# Patient Record
Sex: Female | Born: 1945
Health system: Southern US, Community
[De-identification: ages and names within clinical notes are randomized; demographics above are authoritative.]

## PROBLEM LIST (undated history)

## (undated) DIAGNOSIS — G4700 Insomnia, unspecified: Secondary | ICD-10-CM

## (undated) DIAGNOSIS — G8929 Other chronic pain: Secondary | ICD-10-CM

## (undated) DIAGNOSIS — M810 Age-related osteoporosis without current pathological fracture: Secondary | ICD-10-CM

## (undated) DIAGNOSIS — L309 Dermatitis, unspecified: Secondary | ICD-10-CM

## (undated) DIAGNOSIS — F419 Anxiety disorder, unspecified: Secondary | ICD-10-CM

## (undated) DIAGNOSIS — M545 Low back pain, unspecified: Secondary | ICD-10-CM

## (undated) DIAGNOSIS — K449 Diaphragmatic hernia without obstruction or gangrene: Secondary | ICD-10-CM

## (undated) DIAGNOSIS — D649 Anemia, unspecified: Secondary | ICD-10-CM

## (undated) DIAGNOSIS — K8020 Calculus of gallbladder without cholecystitis without obstruction: Secondary | ICD-10-CM

## (undated) DIAGNOSIS — K5900 Constipation, unspecified: Secondary | ICD-10-CM

## (undated) DIAGNOSIS — M5126 Other intervertebral disc displacement, lumbar region: Secondary | ICD-10-CM

## (undated) DIAGNOSIS — N289 Disorder of kidney and ureter, unspecified: Secondary | ICD-10-CM

## (undated) DIAGNOSIS — J449 Chronic obstructive pulmonary disease, unspecified: Secondary | ICD-10-CM

## (undated) DIAGNOSIS — F329 Major depressive disorder, single episode, unspecified: Secondary | ICD-10-CM

## (undated) DIAGNOSIS — J45909 Unspecified asthma, uncomplicated: Secondary | ICD-10-CM

## (undated) DIAGNOSIS — E041 Nontoxic single thyroid nodule: Secondary | ICD-10-CM

## (undated) DIAGNOSIS — A159 Respiratory tuberculosis unspecified: Secondary | ICD-10-CM

## (undated) DIAGNOSIS — F32A Depression, unspecified: Secondary | ICD-10-CM

## (undated) DIAGNOSIS — T7840XA Allergy, unspecified, initial encounter: Secondary | ICD-10-CM

## (undated) DIAGNOSIS — J309 Allergic rhinitis, unspecified: Secondary | ICD-10-CM

## (undated) DIAGNOSIS — K219 Gastro-esophageal reflux disease without esophagitis: Secondary | ICD-10-CM

## (undated) DIAGNOSIS — I10 Essential (primary) hypertension: Secondary | ICD-10-CM

## (undated) DIAGNOSIS — M1990 Unspecified osteoarthritis, unspecified site: Secondary | ICD-10-CM

## (undated) DIAGNOSIS — H269 Unspecified cataract: Secondary | ICD-10-CM

## (undated) HISTORY — DX: Unspecified osteoarthritis, unspecified site: M19.90

## (undated) HISTORY — DX: Other chronic pain: G89.29

## (undated) HISTORY — PX: BACK SURGERY: SHX140

## (undated) HISTORY — PX: COLONOSCOPY: SHX174

## (undated) HISTORY — DX: Essential (primary) hypertension: I10

## (undated) HISTORY — DX: Anemia, unspecified: D64.9

## (undated) HISTORY — DX: Gastro-esophageal reflux disease without esophagitis: K21.9

## (undated) HISTORY — DX: Insomnia, unspecified: G47.00

## (undated) HISTORY — PX: TONSILLECTOMY: SUR1361

## (undated) HISTORY — PX: CHOLECYSTECTOMY: SHX55

## (undated) HISTORY — DX: Nontoxic single thyroid nodule: E04.1

## (undated) HISTORY — DX: Chronic obstructive pulmonary disease, unspecified: J44.9

## (undated) HISTORY — DX: Other intervertebral disc displacement, lumbar region: M51.26

## (undated) HISTORY — DX: Calculus of gallbladder without cholecystitis without obstruction: K80.20

## (undated) HISTORY — DX: Allergy, unspecified, initial encounter: T78.40XA

## (undated) HISTORY — PX: THYROIDECTOMY, PARTIAL: SHX18

## (undated) HISTORY — DX: Unspecified cataract: H26.9

## (undated) HISTORY — DX: Allergic rhinitis, unspecified: J30.9

## (undated) HISTORY — PX: LUMBAR DISC SURGERY: SHX700

## (undated) HISTORY — PX: TUBAL LIGATION: SHX77

## (undated) HISTORY — DX: Unspecified asthma, uncomplicated: J45.909

---

## 1998-08-10 ENCOUNTER — Other Ambulatory Visit: Admission: RE | Admit: 1998-08-10 | Discharge: 1998-08-10 | Payer: Self-pay | Admitting: Obstetrics & Gynecology

## 1998-09-16 ENCOUNTER — Other Ambulatory Visit: Admission: RE | Admit: 1998-09-16 | Discharge: 1998-09-16 | Payer: Self-pay | Admitting: Obstetrics & Gynecology

## 2001-01-07 ENCOUNTER — Encounter: Payer: Self-pay | Admitting: Family Medicine

## 2001-01-07 ENCOUNTER — Ambulatory Visit (HOSPITAL_COMMUNITY): Admission: RE | Admit: 2001-01-07 | Discharge: 2001-01-07 | Payer: Self-pay | Admitting: Family Medicine

## 2001-05-29 ENCOUNTER — Ambulatory Visit (HOSPITAL_COMMUNITY): Admission: RE | Admit: 2001-05-29 | Discharge: 2001-05-29 | Payer: Self-pay | Admitting: Family Medicine

## 2001-05-29 ENCOUNTER — Encounter: Payer: Self-pay | Admitting: Family Medicine

## 2001-07-15 ENCOUNTER — Other Ambulatory Visit: Admission: RE | Admit: 2001-07-15 | Discharge: 2001-07-15 | Payer: Self-pay | Admitting: Obstetrics & Gynecology

## 2001-07-20 ENCOUNTER — Encounter: Admission: RE | Admit: 2001-07-20 | Discharge: 2001-07-20 | Payer: Self-pay | Admitting: Orthopedic Surgery

## 2001-07-20 ENCOUNTER — Encounter: Payer: Self-pay | Admitting: Orthopedic Surgery

## 2001-10-06 ENCOUNTER — Encounter: Payer: Self-pay | Admitting: Neurosurgery

## 2001-10-08 ENCOUNTER — Encounter: Payer: Self-pay | Admitting: Neurosurgery

## 2001-10-08 ENCOUNTER — Ambulatory Visit (HOSPITAL_COMMUNITY): Admission: RE | Admit: 2001-10-08 | Discharge: 2001-10-08 | Payer: Self-pay | Admitting: Neurosurgery

## 2002-02-06 ENCOUNTER — Encounter: Payer: Self-pay | Admitting: Neurosurgery

## 2002-02-06 ENCOUNTER — Ambulatory Visit (HOSPITAL_COMMUNITY): Admission: RE | Admit: 2002-02-06 | Discharge: 2002-02-06 | Payer: Self-pay | Admitting: Neurosurgery

## 2003-02-18 ENCOUNTER — Encounter: Payer: Self-pay | Admitting: Anesthesiology

## 2003-02-18 ENCOUNTER — Ambulatory Visit (HOSPITAL_COMMUNITY): Admission: RE | Admit: 2003-02-18 | Discharge: 2003-02-18 | Payer: Self-pay | Admitting: Anesthesiology

## 2004-08-18 ENCOUNTER — Other Ambulatory Visit: Admission: RE | Admit: 2004-08-18 | Discharge: 2004-08-18 | Payer: Self-pay | Admitting: Obstetrics & Gynecology

## 2010-11-28 ENCOUNTER — Other Ambulatory Visit: Payer: Self-pay | Admitting: Family Medicine

## 2010-11-30 ENCOUNTER — Ambulatory Visit
Admission: RE | Admit: 2010-11-30 | Discharge: 2010-11-30 | Disposition: A | Discharge status: Home / Self Care | Payer: No Typology Code available for payment source | Source: Ambulatory Visit | Attending: Family Medicine | Admitting: Family Medicine

## 2010-11-30 IMAGING — US US ABDOMEN COMPLETE
1 series · 14 of 25 positions shown · non-contrast
Comparison: None.

CLINICAL DATA: Abdominal pain, chest pain

COMPLETE ABDOMINAL ULTRASOUND

[Series 1: us abdomen complete · 14 of 105 slices shown]
[im 1/105]
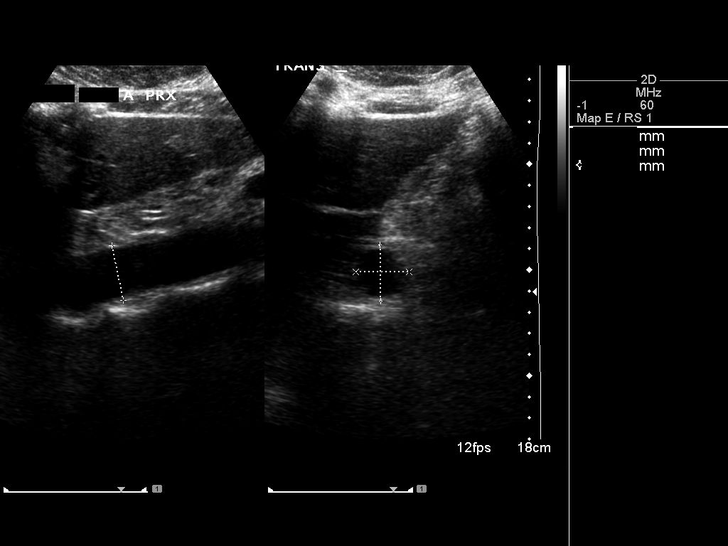
[im 9/105]
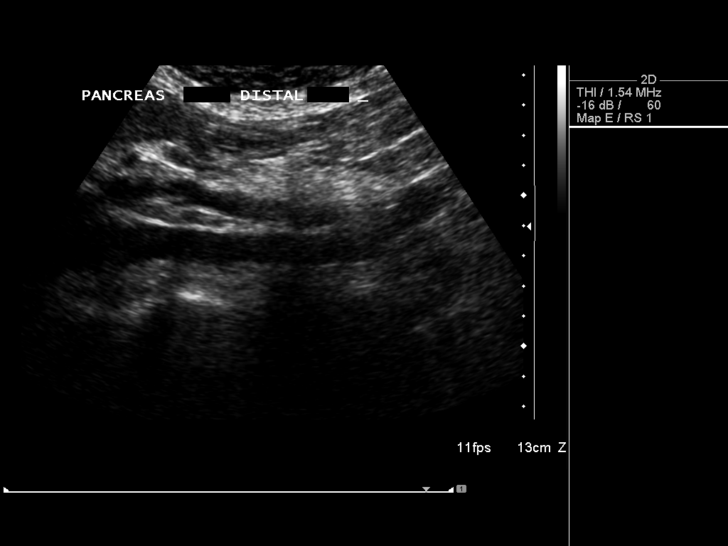
[im 18/105]
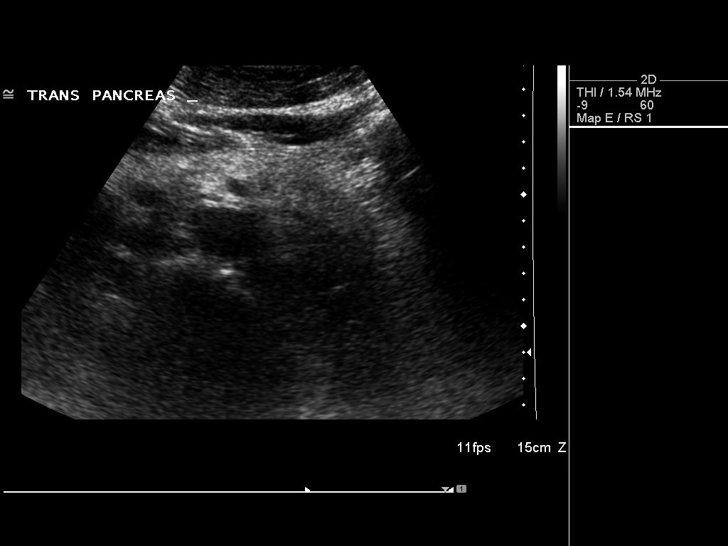
[im 27/105]
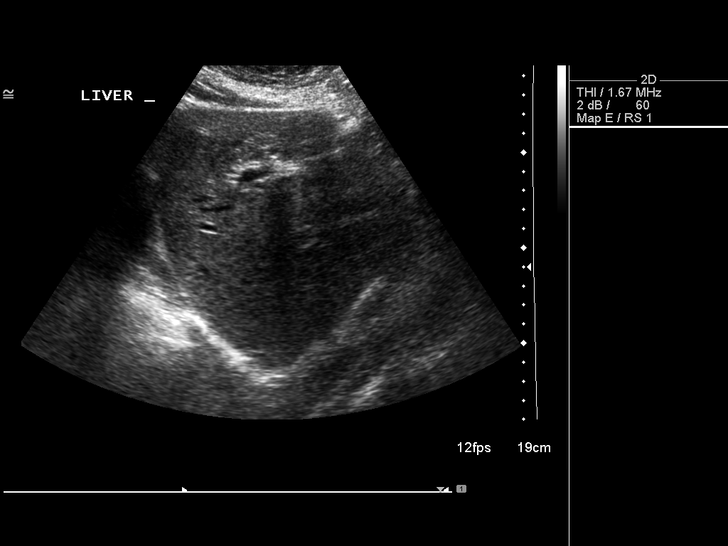
[im 35/105]
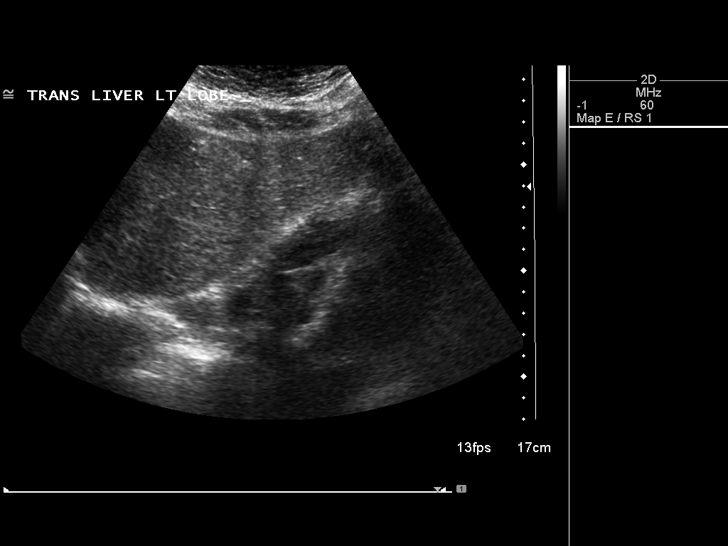
[im 40/105]
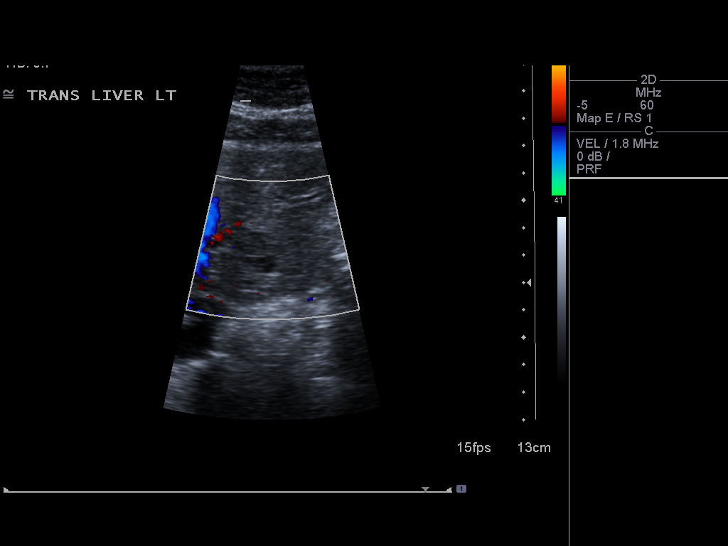
[im 48/105]
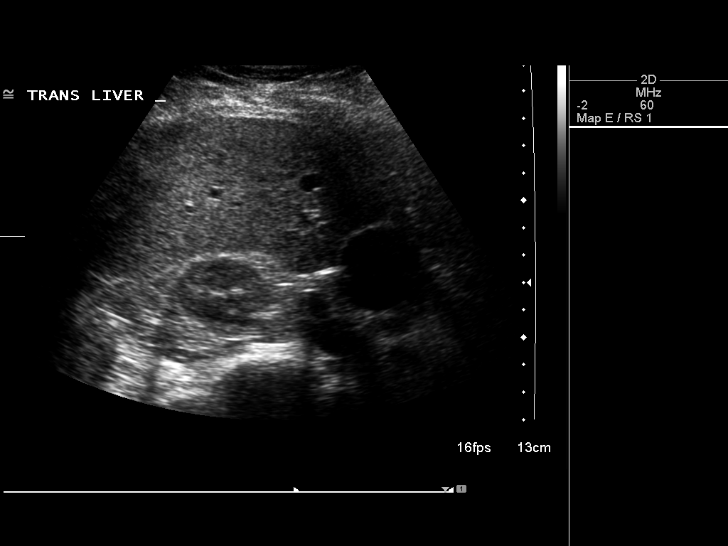
[im 57/105]
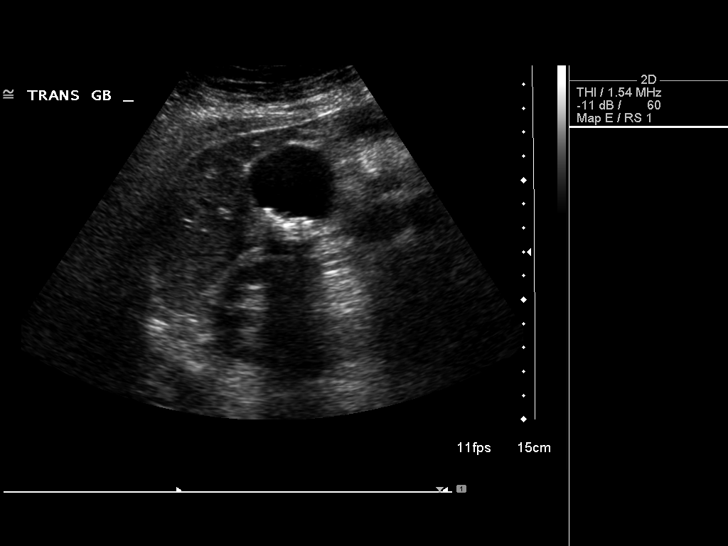
[im 66/105]
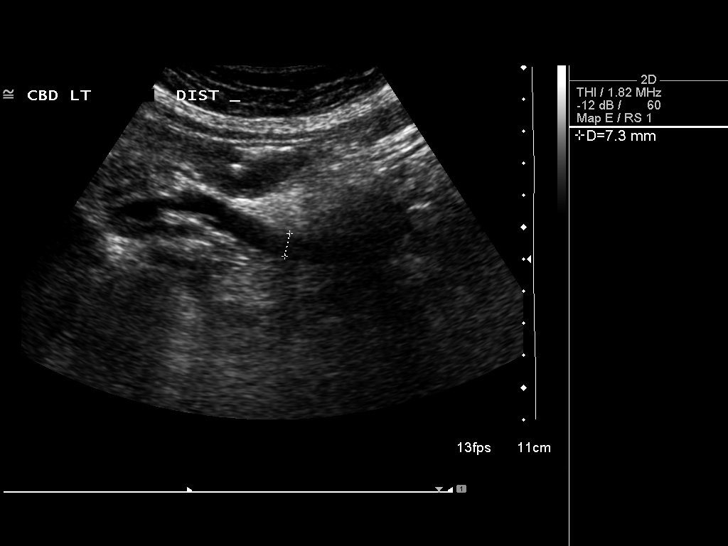
[im 70/105]
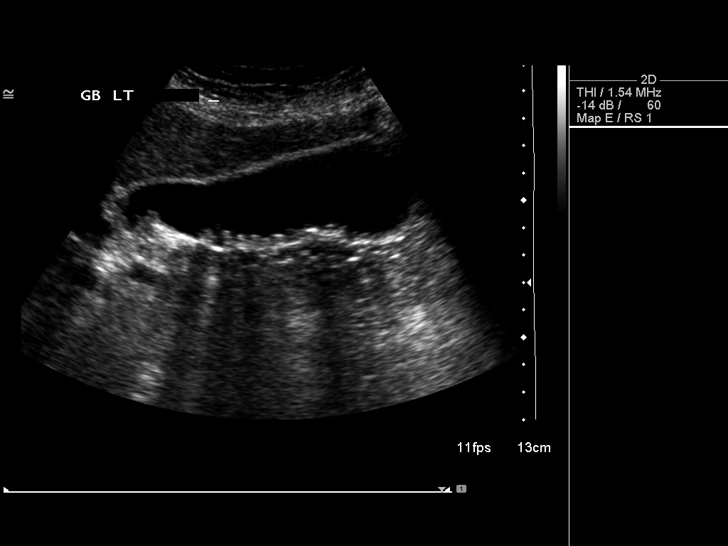
[im 79/105]
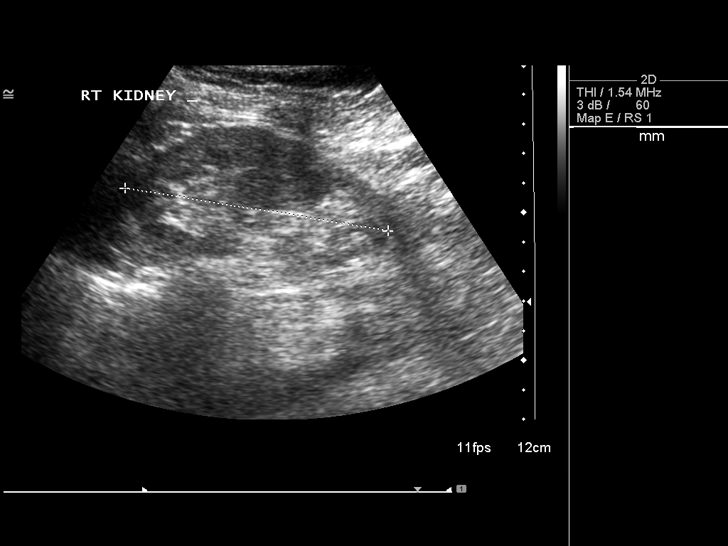
[im 87/105]
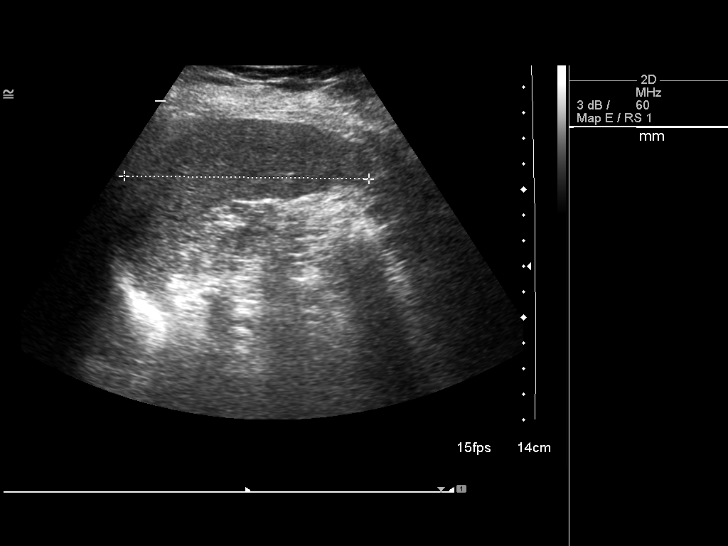
[im 96/105]
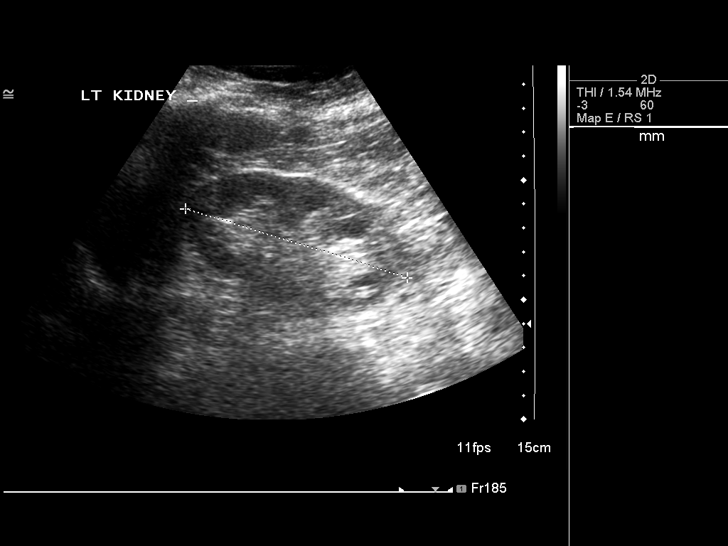
[im 105/105]
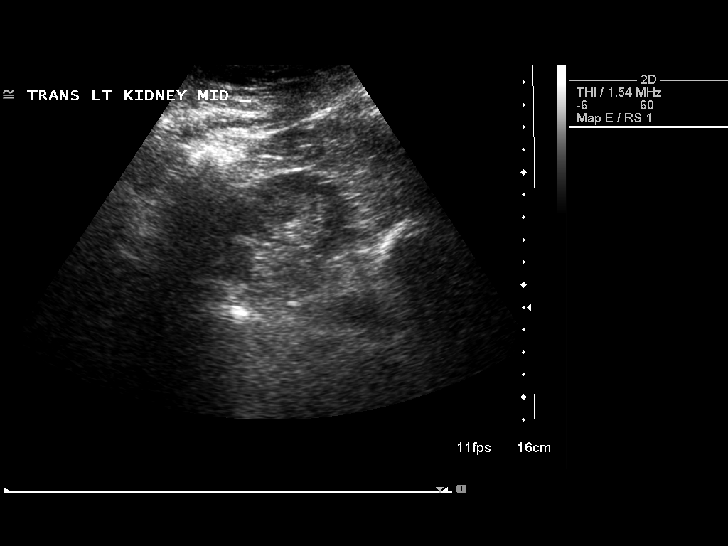

[14 of 25 positions shown; findings below may reference images not displayed]

FINDINGS: Gallbladder:  There are multiple echogenic gallstones within the
gallbladder.  The stones  measures 6 mm or less and number
approximately 20.  No gallbladder wall thickening or
pericholecystic fluid.  Negative sonographic Murphy's sign.

Common bile duct:  The common bile duct is prominent measuring up
to 10.5 mm.

Liver:  No intrahepatic ductal dilatation.  Two small 1 cm anechoic
cysts are noted.

IVC:  Appears normal.

Pancreas:  No focal abnormality seen.

Spleen:  Normal in size and echogenicity peri

Right Kidney:  9.0cm in length.  No evidence of hydronephrosis or
stones.

Left Kidney:  9.7cm in length.  No evidence of hydronephrosis or
stones.

Abdominal aorta:  No aneurysm identified.
IMPRESSION: 1.  Multiple small gallstones with out evidence of acute
cholecystitis.
2.  Dilatation of the common bile duct.  Cannot exclude a distal
partially obstructing stone.  If the patient has elevated LFTs or
bilirubin, consider MRCP for further evaluation of the distal
common bile duct.
3.  Two benign-appearing hepatic cysts are noted.

## 2011-01-17 ENCOUNTER — Ambulatory Visit (HOSPITAL_COMMUNITY)
Admission: RE | Admit: 2011-01-17 | Discharge: 2011-01-17 | Disposition: A | Discharge status: Home / Self Care | Payer: No Typology Code available for payment source | Source: Ambulatory Visit | Attending: General Surgery | Admitting: General Surgery

## 2011-01-17 ENCOUNTER — Other Ambulatory Visit (HOSPITAL_COMMUNITY): Payer: Self-pay | Admitting: General Surgery

## 2011-01-17 ENCOUNTER — Encounter (HOSPITAL_COMMUNITY)
Admission: RE | Admit: 2011-01-17 | Discharge: 2011-01-17 | Disposition: A | Discharge status: Home / Self Care | Payer: No Typology Code available for payment source | Source: Ambulatory Visit | Attending: General Surgery | Admitting: General Surgery

## 2011-01-17 DIAGNOSIS — Z01818 Encounter for other preprocedural examination: Secondary | ICD-10-CM | POA: Insufficient documentation

## 2011-01-17 DIAGNOSIS — K8020 Calculus of gallbladder without cholecystitis without obstruction: Secondary | ICD-10-CM

## 2011-01-17 DIAGNOSIS — Z01812 Encounter for preprocedural laboratory examination: Secondary | ICD-10-CM | POA: Insufficient documentation

## 2011-01-17 LAB — COMPREHENSIVE METABOLIC PANEL
Albumin: 3.8 g/dL (ref 3.5–5.2)
Alkaline Phosphatase: 156 U/L — ABNORMAL HIGH (ref 39–117)
BUN: 10 mg/dL (ref 6–23)
Chloride: 103 mEq/L (ref 96–112)
Creatinine, Ser: 0.9 mg/dL (ref 0.4–1.2)
Glucose, Bld: 97 mg/dL (ref 70–99)
Potassium: 3.6 mEq/L (ref 3.5–5.1)
Total Bilirubin: 0.5 mg/dL (ref 0.3–1.2)

## 2011-01-17 LAB — CBC
HCT: 35.8 % — ABNORMAL LOW (ref 36.0–46.0)
MCH: 30.5 pg (ref 26.0–34.0)
MCV: 91.1 fL (ref 78.0–100.0)
Platelets: 248 10*3/uL (ref 150–400)
RBC: 3.93 MIL/uL (ref 3.87–5.11)
RDW: 13.9 % (ref 11.5–15.5)
WBC: 8 10*3/uL (ref 4.0–10.5)

## 2011-01-17 LAB — DIFFERENTIAL
Eosinophils Absolute: 0.4 10*3/uL (ref 0.0–0.7)
Eosinophils Relative: 5 % (ref 0–5)
Lymphocytes Relative: 27 % (ref 12–46)
Lymphs Abs: 2.2 10*3/uL (ref 0.7–4.0)
Monocytes Relative: 9 % (ref 3–12)

## 2011-01-17 LAB — SURGICAL PCR SCREEN
MRSA, PCR: NEGATIVE
Staphylococcus aureus: POSITIVE — AB

## 2011-01-17 IMAGING — CR DG CHEST 2V
2 series · 2 of 2 positions shown · non-contrast
Comparison: None.

CLINICAL DATA: Preop evaluation for cholelithiasis.

CHEST - 2 VIEW

[view not recorded (1 of 2)]
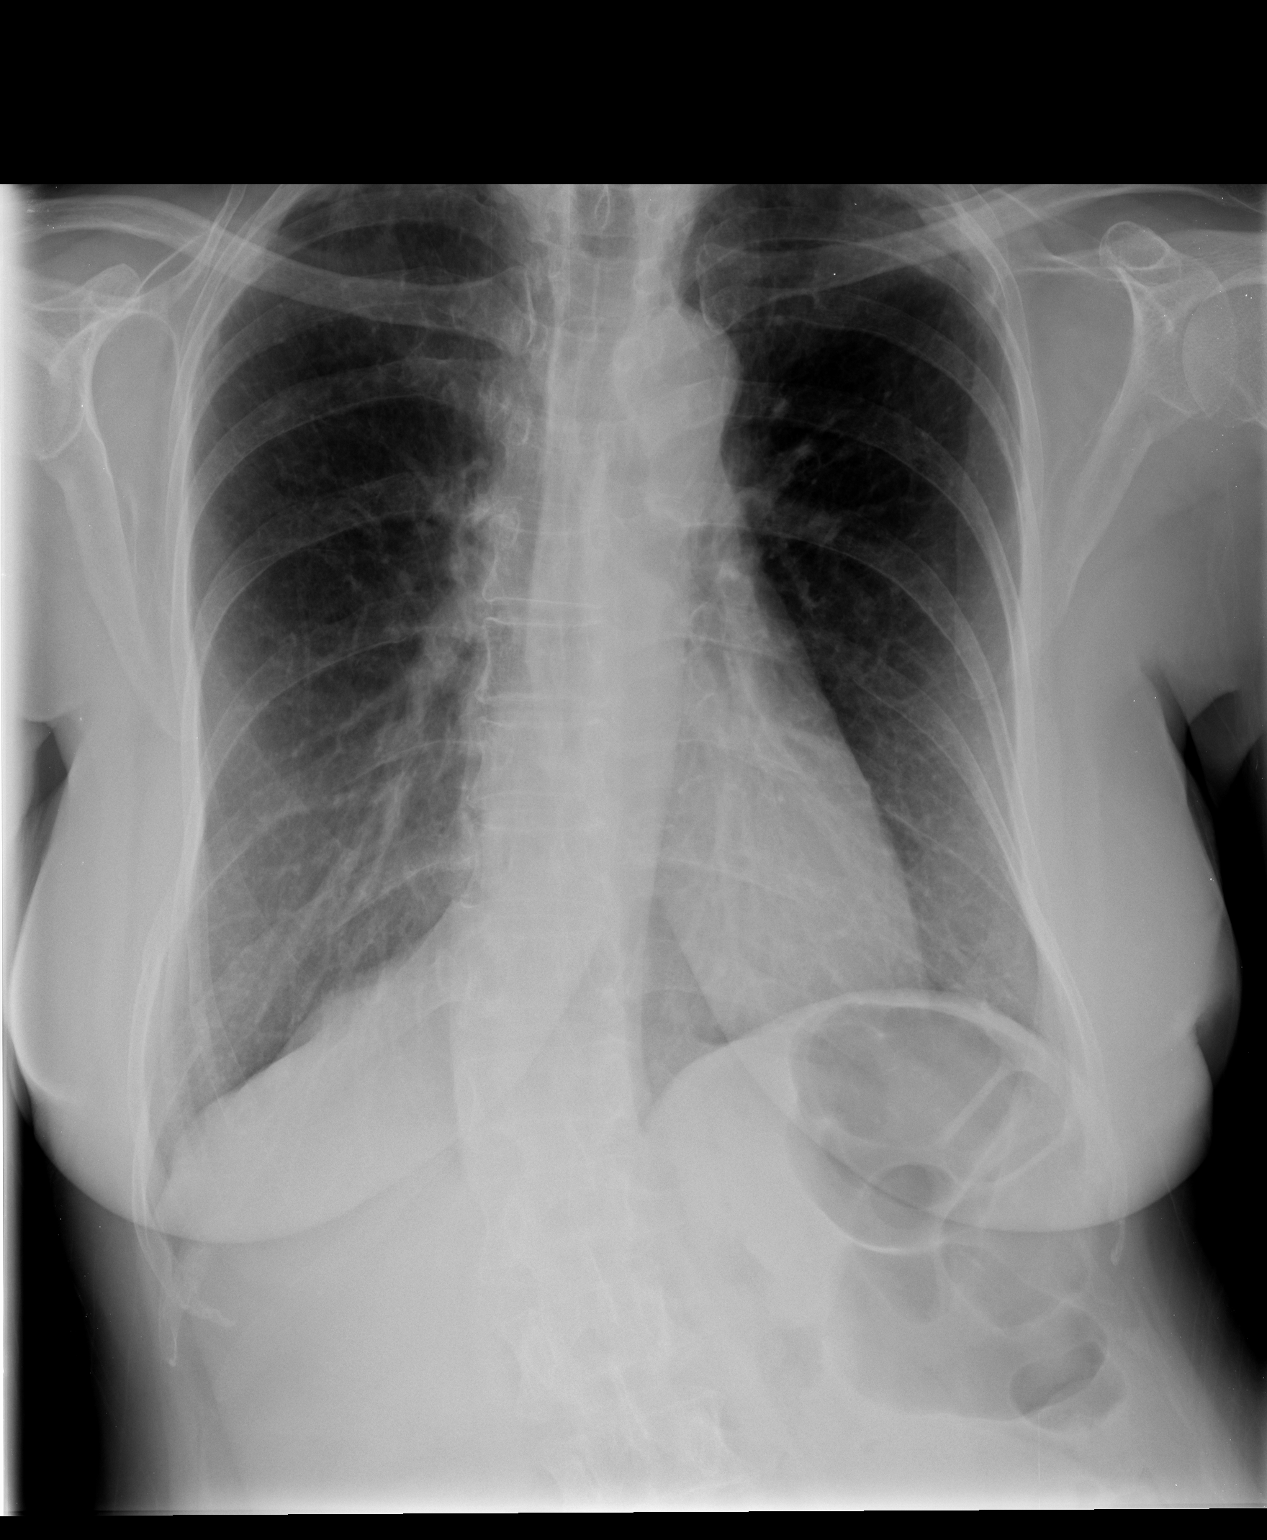

[view not recorded (2 of 2)]
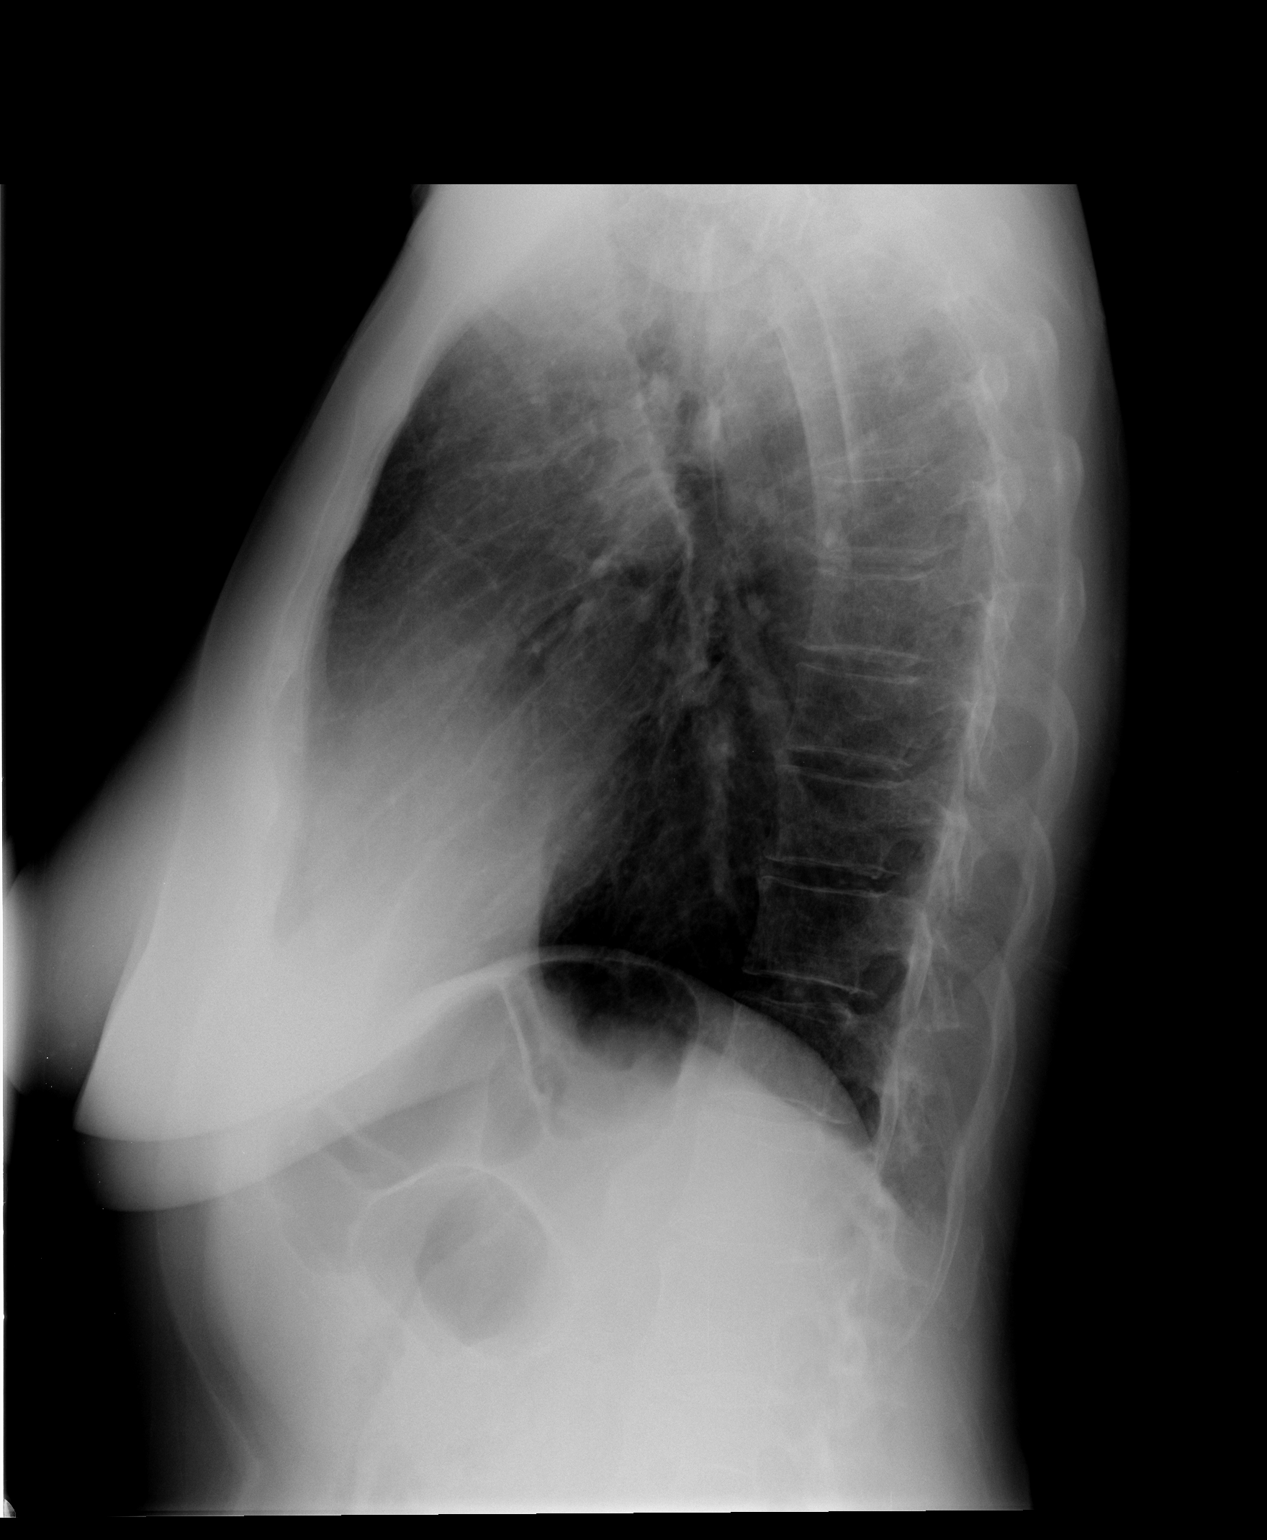

[2 of 2 positions shown; findings below may reference images not displayed]

FINDINGS: Hyperexpansion is consistent with emphysema.  Biapical
pleuroparenchymal opacity is symmetric most likely reflects
scarring. The cardiopericardial silhouette is enlarged. Imaged bony
structures of the thorax are intact.
IMPRESSION: No acute cardiopulmonary findings.

## 2011-01-23 ENCOUNTER — Ambulatory Visit (HOSPITAL_COMMUNITY): Payer: No Typology Code available for payment source

## 2011-01-23 ENCOUNTER — Observation Stay (HOSPITAL_COMMUNITY)
Admission: RE | Admit: 2011-01-23 | Discharge: 2011-01-25 | Disposition: A | Discharge status: Home / Self Care | Payer: No Typology Code available for payment source | Source: Ambulatory Visit | Attending: General Surgery | Admitting: General Surgery

## 2011-01-23 ENCOUNTER — Other Ambulatory Visit: Payer: Self-pay | Admitting: General Surgery

## 2011-01-23 DIAGNOSIS — E039 Hypothyroidism, unspecified: Secondary | ICD-10-CM | POA: Insufficient documentation

## 2011-01-23 DIAGNOSIS — Z87891 Personal history of nicotine dependence: Secondary | ICD-10-CM | POA: Insufficient documentation

## 2011-01-23 DIAGNOSIS — K8070 Calculus of gallbladder and bile duct without cholecystitis without obstruction: Principal | ICD-10-CM | POA: Insufficient documentation

## 2011-01-23 DIAGNOSIS — G43909 Migraine, unspecified, not intractable, without status migrainosus: Secondary | ICD-10-CM | POA: Insufficient documentation

## 2011-01-23 DIAGNOSIS — M129 Arthropathy, unspecified: Secondary | ICD-10-CM | POA: Insufficient documentation

## 2011-01-23 IMAGING — RF DG CHOLANGIOGRAM OPERATIVE
1 series · 5 of 5 positions shown · non-contrast
Comparison: None

CLINICAL DATA: Cholelithiasis

INTRAOPERATIVE CHOLANGIOGRAM
TECHNIQUE: Cholangiographic images from the C-arm fluoroscopic
device were submitted for interpretation post-operatively.  Please
see the procedural report for the amount of contrast and the
fluoroscopy time utilized.

[Series 1: run · 2 acquisitions, 5 frames shown]
[im 1/2]
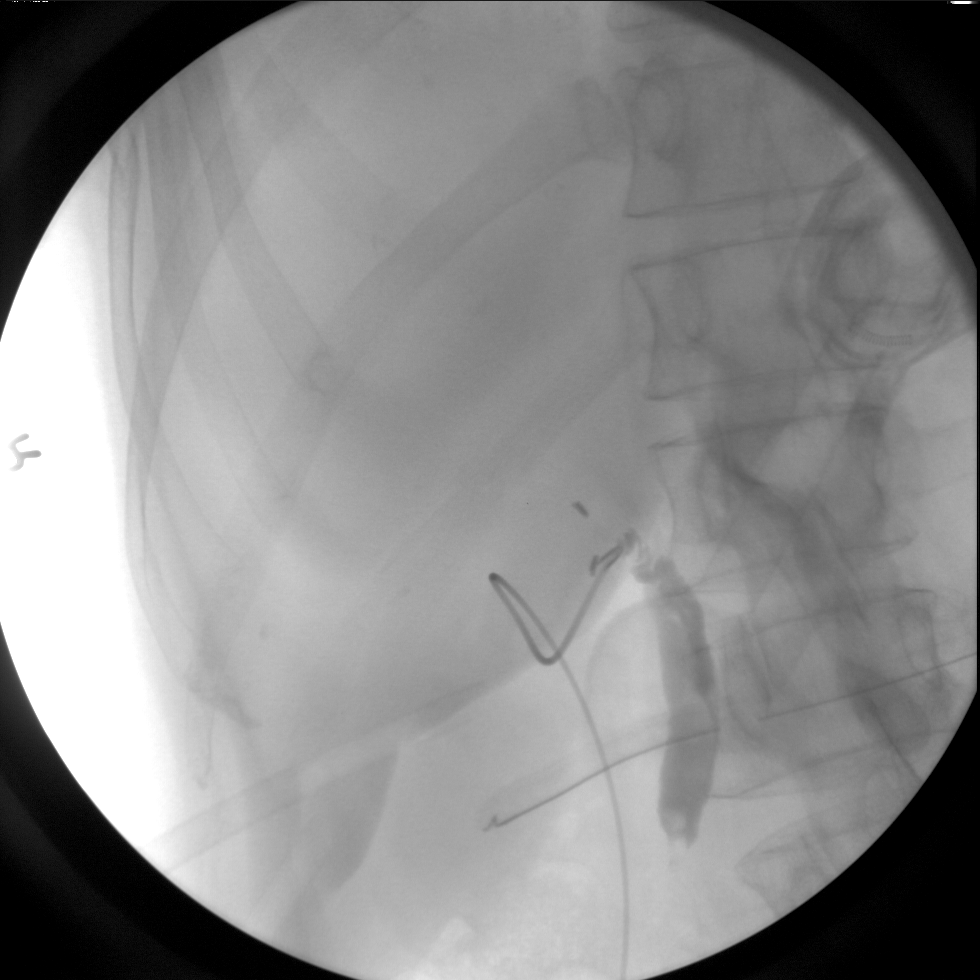
[im 1/2]
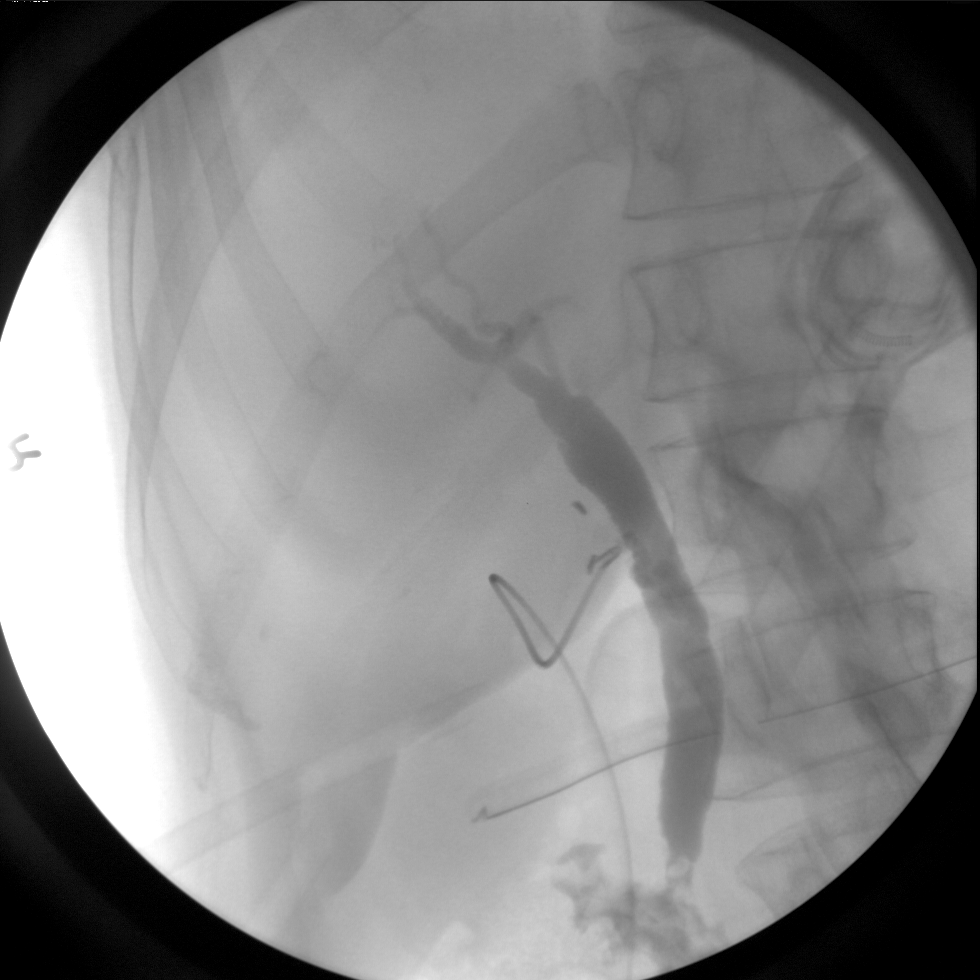
[im 1/2]
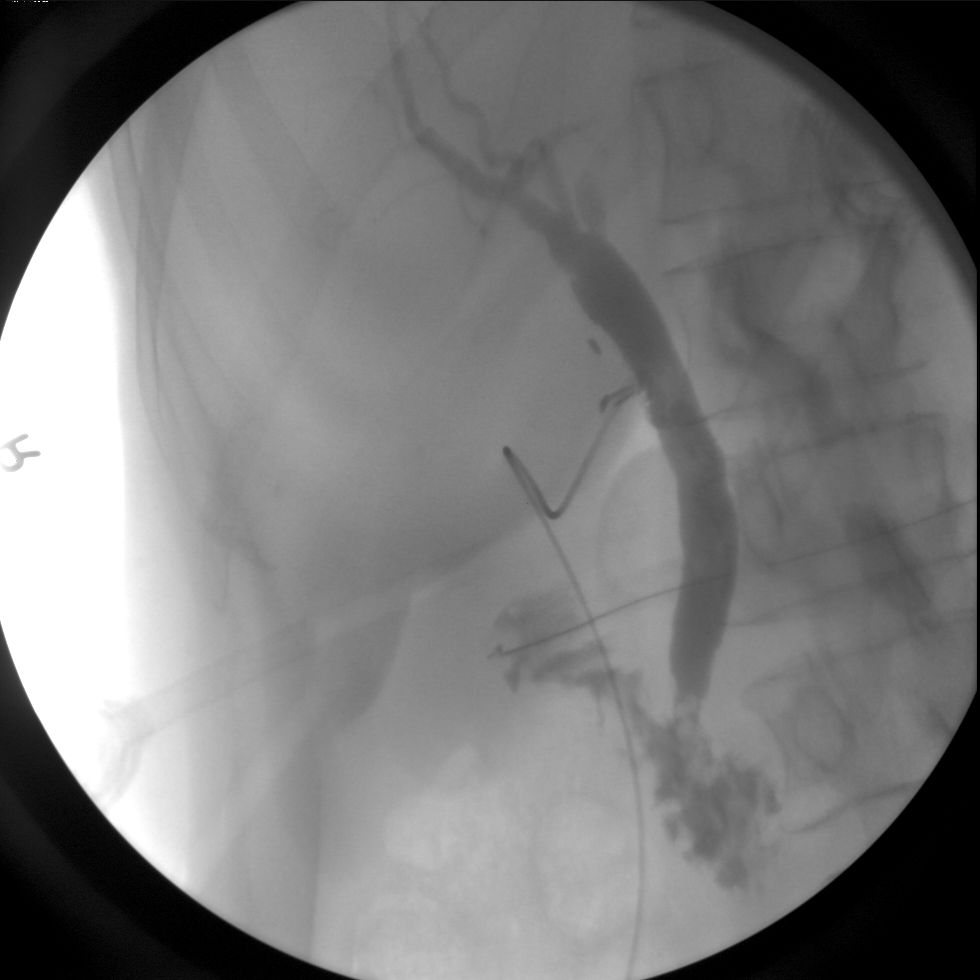
[im 1/2]
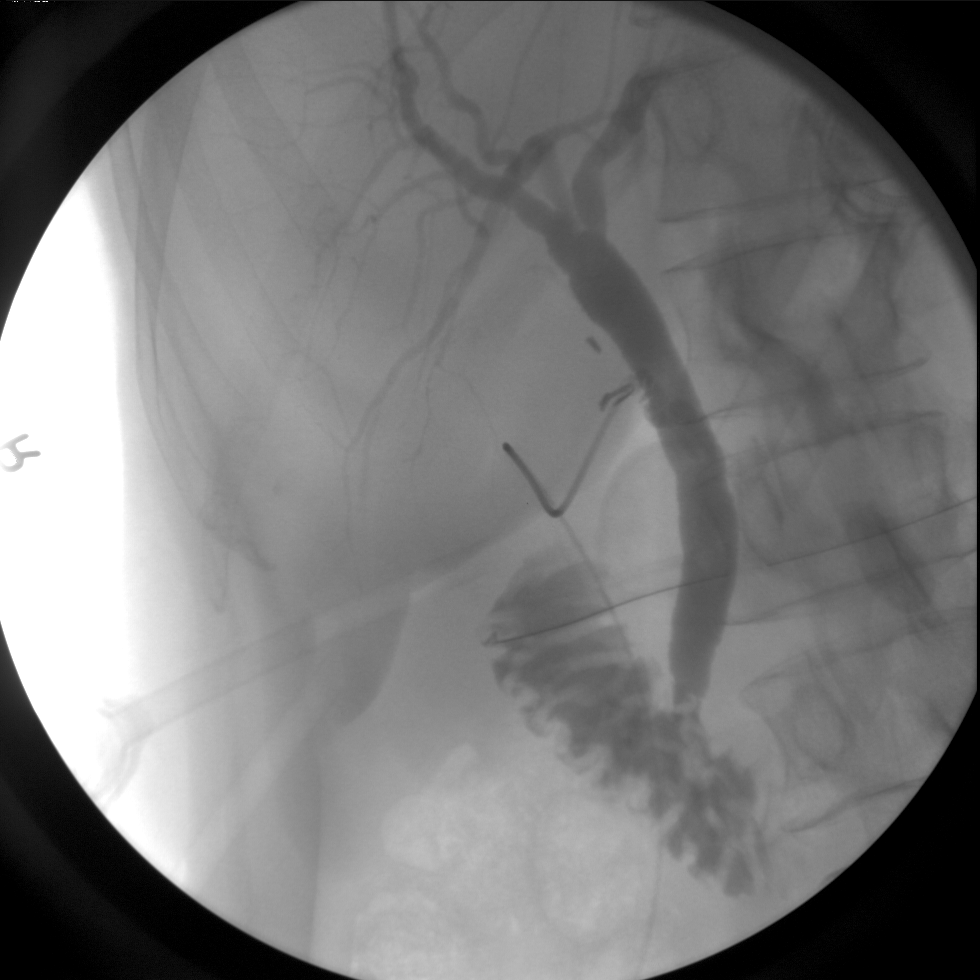
[im 2/2]
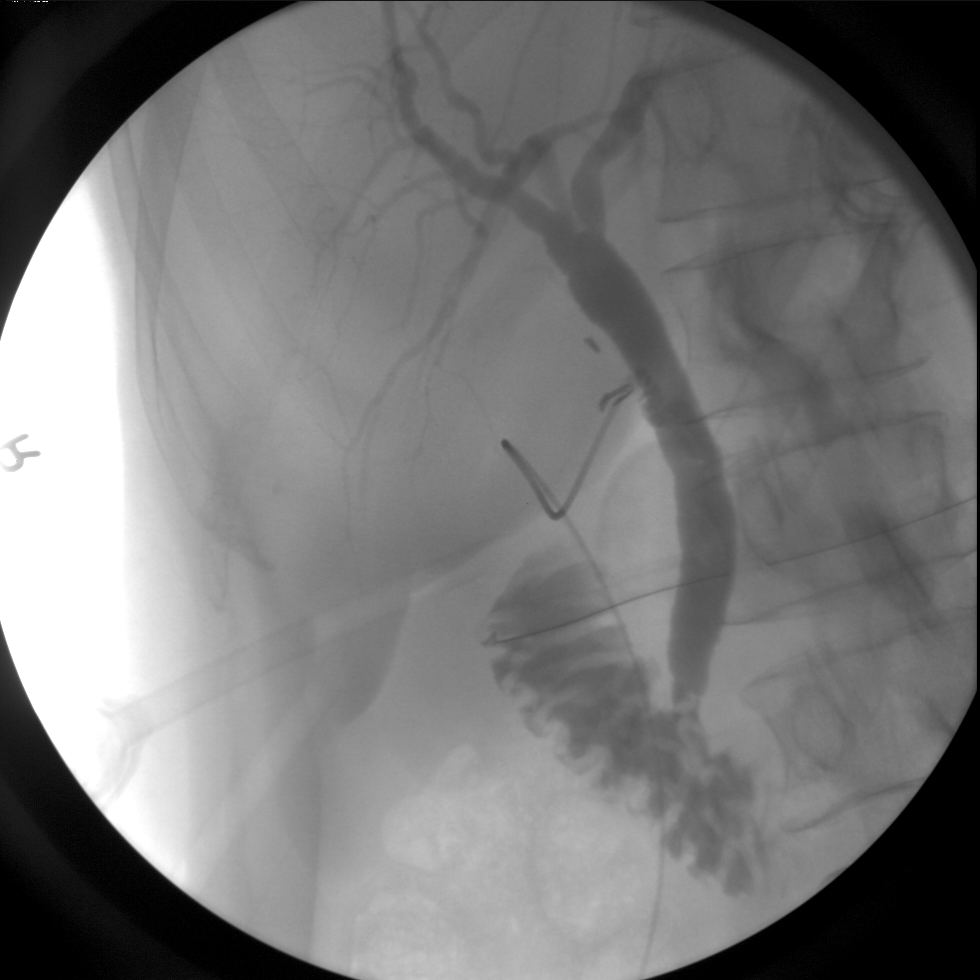

[5 of 5 positions shown; findings below may reference images not displayed]

FINDINGS: Several small partially occlusive filling defects in the
distal common duct. Intrahepatic ducts are incompletely visualized,
appearing decompressed centrally. Contrast passes into the
duodenum.

IMPRESSION

Retained distal common duct stones.

## 2011-01-24 ENCOUNTER — Observation Stay (HOSPITAL_COMMUNITY): Payer: No Typology Code available for payment source

## 2011-01-24 IMAGING — RF DG ERCP WO/W SPHINCTEROTOMY
1 series · 7 of 7 positions shown · non-contrast
Comparison: Intraoperative cholangiogram dated [DATE]

CLINICAL DATA: Retained common bile duct stone.

ERCP with sphincterotomy.
TECHNIQUE: Multiple spot images obtained with the fluoroscopic
device and submitted for interpretation post-procedure.  ERCP was
performed by Dr. PRESTON.

[Series 1: run · 7 of 7 slices shown]
[im 1/7]
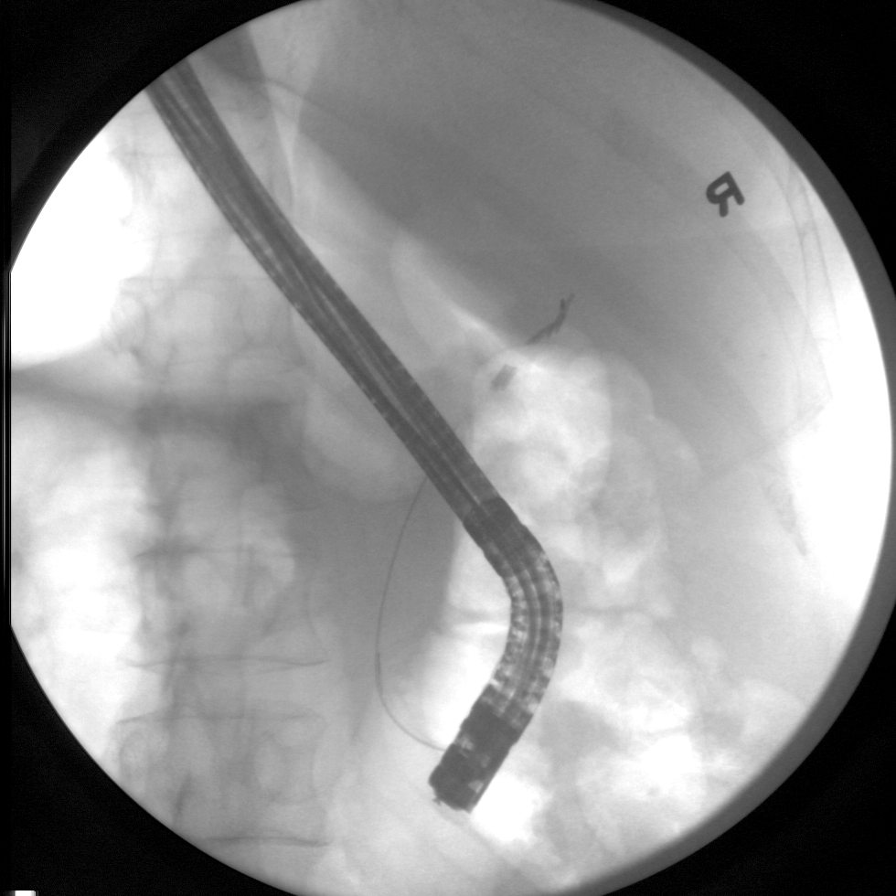
[im 2/7]
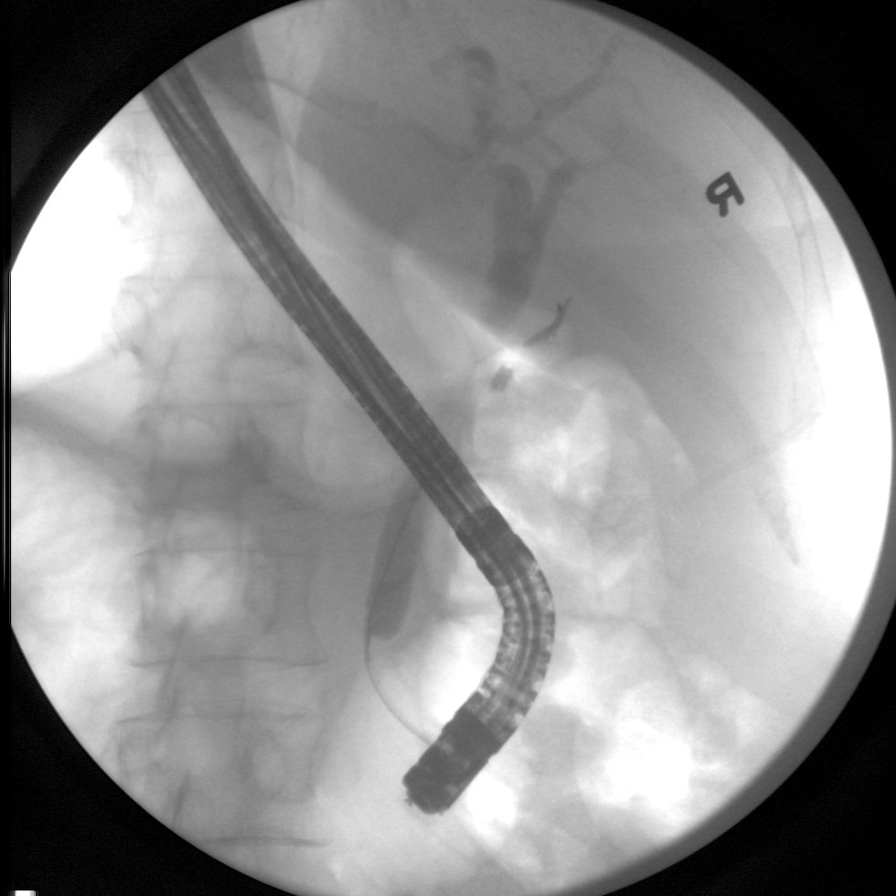
[im 3/7]
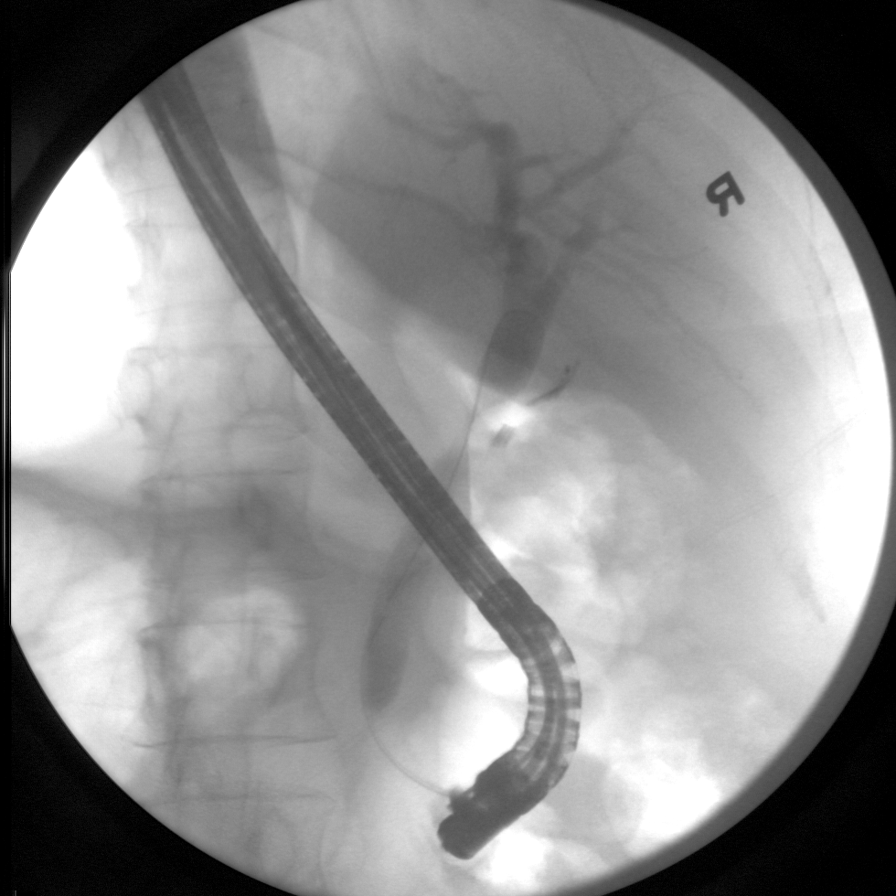
[im 4/7]
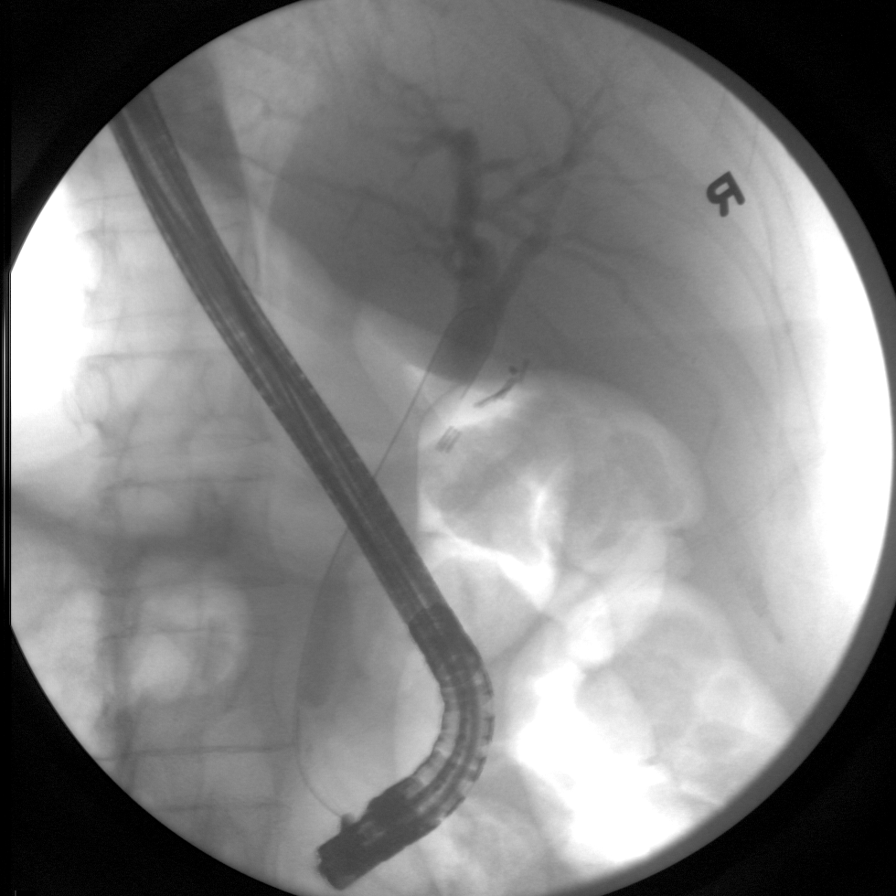
[im 5/7]
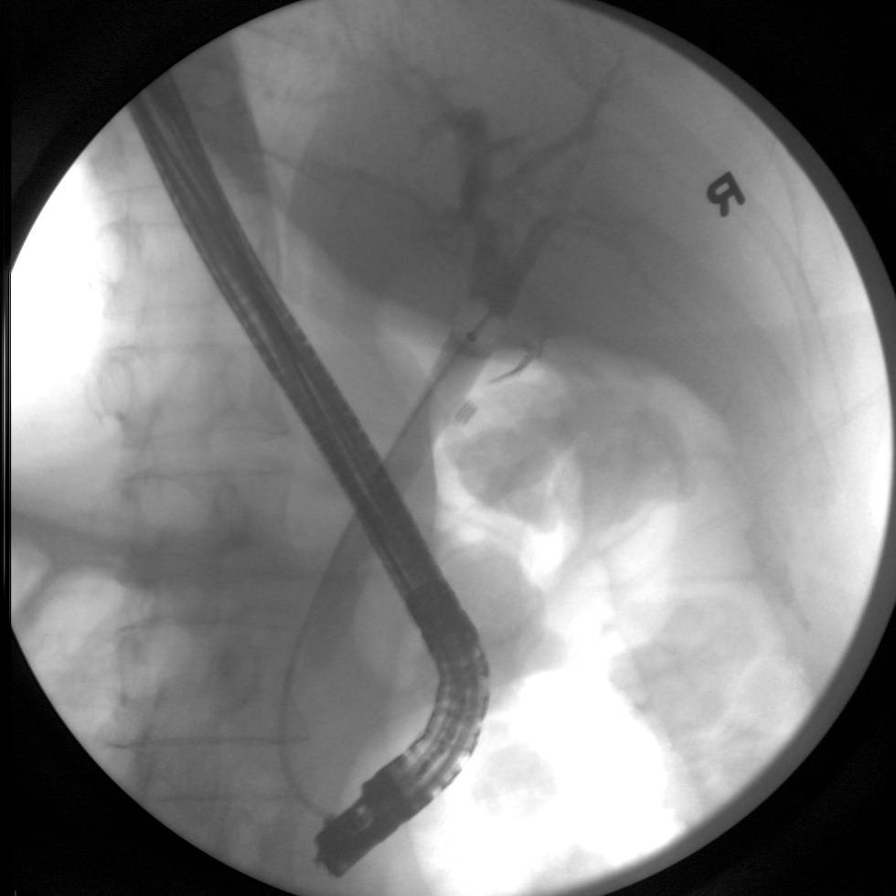
[im 6/7]
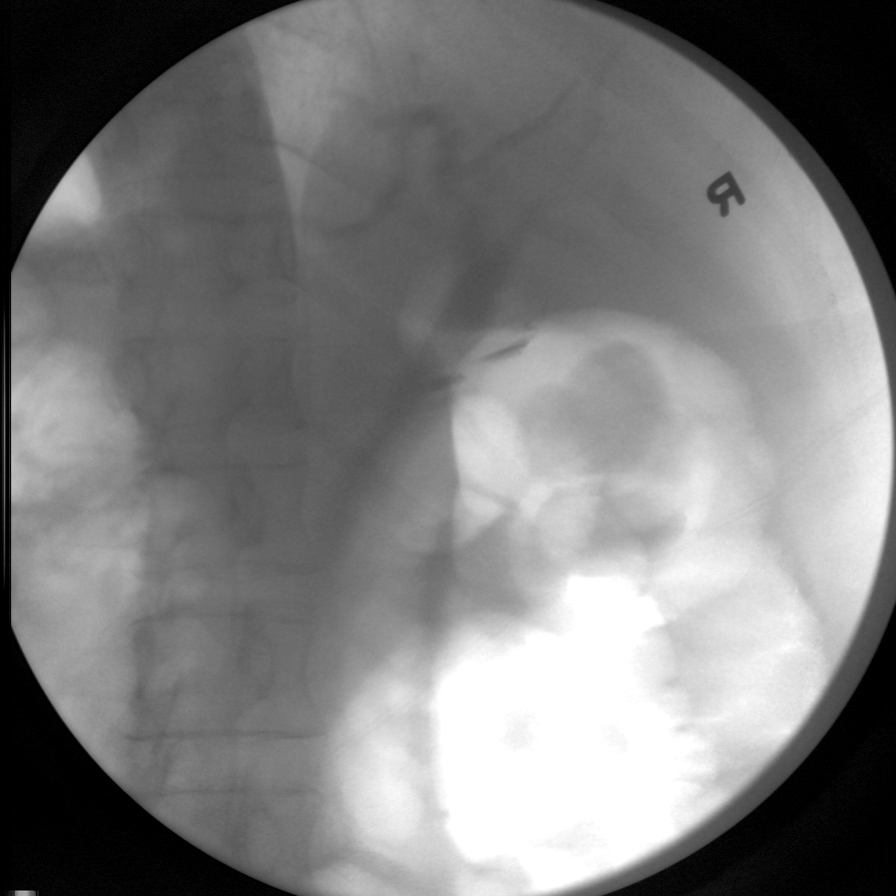
[im 7/7]
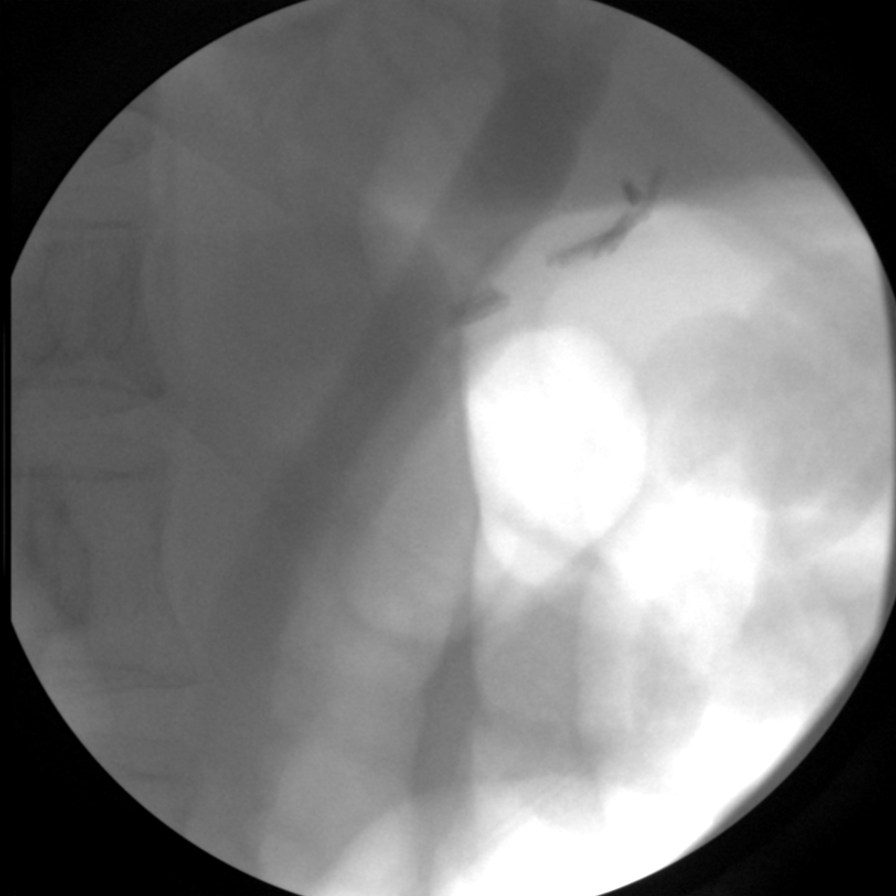

[7 of 7 positions shown; findings below may reference images not displayed]

FINDINGS: Cholangiogram and sphincterotomy were performed and
stones were extracted.
IMPRESSION: Sphincterotomy and stone extraction by Dr. PRESTON.

These images were submitted for radiologic interpretation only.
Please see the procedural report for the amount of contrast and the
fluoroscopy time utilized.

## 2011-01-29 NOTE — Op Note (Signed)
NAMETONIQUA, MELAMED                 ACCOUNT NO.:  [id number]  MEDICAL RECORD NO.:  [id number]           PATIENT TYPE:  O  LOCATION:  5121                         FACILITY:  [facility]  PHYSICIAN:  Ollen Gross. Vernell Morgans, M.D. DATE OF BIRTH:  Jul 03, 1946  DATE OF PROCEDURE:  01/23/2011 DATE OF DISCHARGE:                              OPERATIVE REPORT   PREOPERATIVE DIAGNOSIS:  Gallstones.  POSTOPERATIVE DIAGNOSES:  Gallstones with retained common bile duct stones.  PROCEDURE:  Laparoscopic cholecystectomy with intraoperative cholangiogram.  SURGEON:  Ollen Gross. Vernell Morgans, MD  ASSISTANT:  Ardeth Sportsman, MD  ANESTHESIA:  General endotracheal.  PROCEDURE IN DETAIL:  After informed consent was obtained, the patient was brought to the operating room, placed in supine position on the operating table.  After induction of general anesthesia, the patient's abdomen was prepped with ChloraPrep, allowed to dry, and then draped in usual sterile manner.  The area below the umbilicus was infiltrated with 0.25% Marcaine.  Small incision was made with a 15 blade knife.  This incision was carried down through the subcutaneous tissue bluntly with a hemostat and [employer] retractors until linea alba was identified.  The linea alba was incised with 15 blade knife and each side was grasped Kocher clamps and elevated anteriorly.  The preperitoneal space was then probed bluntly with a hemostat until the peritoneum was opened.  Access was gained to the abdominal cavity.  A 0 Vicryl pursestring stitch was placed in the fascia around the opening.  A Hasson cannula was placed through the opening and anchored in place with a previously placed Vicryl pursestring stitch.  The abdomen was then insufflated with carbon dioxide without difficulty.  The patient was placed in reverse Trendelenburg position, rotated with the right side up.  The laparoscope was inserted through the Hasson cannula and the right upper quadrant  was inspected.  The dome of the gallbladder and liver were readily identified.  Next, the epigastric region was infiltrated with 0.25% Marcaine.  A small incision was made with 15 blade knife.  A 10-mm port was then placed bluntly through this incision into the abdominal cavity under direct vision.  Sites were then chosen laterally on the right side of the abdomen for placement of 5-mm ports.  Each of these areas were infiltrated with 0.25% Marcaine.  Small stab incisions were made with a 15 blade knife and 5-mm ports were placed bluntly through these incisions into the abdominal cavity under direct vision.  Blunt grasper was placed through the lateral-most 5-mm port and used to grasp the dome of the gallbladder and elevated anteriorly and superiorly.  Another blunt grasper was placed through the other 5-mm port and used to retract on the body and neck of the gallbladder.  Dissector was placed through the epigastric port using electrocautery.  The peritoneal reflection at the gallbladder neck was opened.  Blunt dissection was then carried out in this area until the gallbladder neck and cystic duct junction was readily identified, and a good window was created.  A single clip was placed on the gallbladder neck.  A small ductotomy was made  just below the clip with the laparoscopic scissors.  A Cook cholangiogram catheter was then placed percutaneously through the right upper quadrant under direct vision.  The plate was removed and the saline and contrast were connected to catheter.  The catheter was flushed.  The tip of the catheter was then placed in the cystic duct and anchored in place with a clip.  Cholangiogram was obtained that showed few small filling defects, they were not obstructing.  There was good emptying into the duodenum and adequate length on the cystic duct, although the cystic duct was tortuous and would not allow the [facility] catheter any farther.  Since we could not feed the  [place] catheter in, we removed the catheter and the anchoring clips, placed three clips on the cystic duct, divided the cystic duct between the two sets of clips.  Posterior to this, the cystic artery was identified and again dissected bluntly in a circumferential manner until a good window was created.  Two clips were placed proximally and distally and the artery was divided between the two.  Next, laparoscopic hook cautery device was used to separate the gallbladder from the liver bed.  Prior to completely detaching the gallbladder from the liver bed, the liver bed was inspected.  Several small bleeding points were coagulated with the electrocautery until the area was completely hemostatic.  The gallbladder was then detached and wrested away from liver bed without difficulty with the cautery.  A laparoscopic bag was inserted through the epigastric port.  The gallbladder was then placed in the bag and bag was sealed.  The abdomen was then irrigated with copious amounts of saline until the effluent was clear.  The laparoscope was then moved to the epigastric port.  A gallbladder grasper was placed through the Hasson cannula and used to grasp the opening of the bag.  The bag with the gallbladder was removed with the Hasson cannula through the infraumbilical port without difficulty.  The fascial defect was closed with previously placed Vicryl purse-string stitch as well as with another figure-of-eight 0 Vicryl stitch.  The rest of the ports were removed under direct vision and were found to be hemostatic.  The gas was allowed to escape.  The skin incisions were all closed with interrupted 4-0 Monocryl subcuticular stitches.  Dermabond dressings were applied.  The patient tolerated the procedure well.  At the end of the case, all needle, sponge, and instrument counts were correct.  The patient was then awakened and taken to recovery in stable condition.     Ollen Gross. Vernell Morgans,  M.D.     PST/MEDQ  D:  01/23/2011  T:  01/24/2011  Job:  [id number]  Electronically Signed by Chevis Pretty III M.D. on 01/29/2011 10:29:45 AM

## 2011-01-30 NOTE — Consult Note (Signed)
  NAMEKAYTLAN, BEHRMAN NO.:  [id number]  MEDICAL RECORD NO.:  [id number]           PATIENT TYPE:  O  LOCATION:  5121                         FACILITY:  [facility]  PHYSICIAN:  Graylin Shiver, M.D.   DATE OF BIRTH:  08/21/1946  DATE OF CONSULTATION:  01/23/2011 DATE OF DISCHARGE:                                CONSULTATION   REASON FOR CONSULTATION:  The patient is a 65 year old female who had a laparoscopic cholecystectomy done today because of symptomatic cholelithiasis.  Her intraoperative cholangiogram was positive for distal common bile duct stone and we were consulted for ERCP.  PAST MEDICAL HISTORY: 1. Eczema. 2. Anxiety. 3. Depression. 4. History of a hiatal hernia. 5. Osteoporosis. 6. Lumbar back pain.  PAST SURGICAL HISTORY: 1. Tonsillectomy. 2. Back surgery. 3. Tubal ligation. 4. Part of thyroid removed in the past.  MEDICATIONS:  Xanax and methadone.  ALLERGIES:  None known.  PHYSICAL EXAMINATION:  GENERAL:  She does not appear in any acute distress.  She is nonicteric. HEART:  Regular rhythm.  No murmurs. LUNGS:  Clear. ABDOMEN:  Soft.  She is postop.  IMPRESSION:  Choledocholithiasis.  PLAN:  We will schedule the patient for an ERCP with sphincterotomy. The procedure was explained in detail to her along with the potential risks of bleeding, infection, perforation, and pancreatitis.  She understands and consents to the procedure.          ______________________________ Graylin Shiver, M.D.     SFG/MEDQ  D:  01/23/2011  T:  01/24/2011  Job:  [id number]  cc:   Juluis Rainier, M.D.  Electronically Signed by Herbert Moors MD on 01/30/2011 04:38:52 PM

## 2011-01-30 NOTE — Op Note (Signed)
  [name], Susan Grimes NO.:  [id number]  MEDICAL RECORD NO.:  [id number]          PATIENT TYPE:  OBV  LOCATION:                               FACILITY:  [facility]  PHYSICIAN:  Graylin Shiver, M.D.   DATE OF BIRTH:  September 23, 1946  DATE OF PROCEDURE: DATE OF DISCHARGE:                              OPERATIVE REPORT   Endoscopic retrograde cholangiogram with sphincterotomy and stone extraction.  INDICATIONS FOR PROCEDURE:  The patient is a 65 year old female who had a laparoscopic cholecystectomy done yesterday secondary to gallstones and her intraoperative cholangiogram was positive for filling defects compatible with CBD stone.  The procedure of ERCP was explained to the patient along with potential risks of bleeding, infection, perforation, and pancreatitis.  She was agreeable to proceed.  Informed consent was obtained after explanation of the risks of bleeding, infection, perforation, and pancreatitis.  PREMEDICATION: 1. Dilaudid 1 mg IV. 2. Benadryl 25 mg IV. 3. Versed 12 mg IV.  PROCEDURE:  With the patient lying on her abdomen on the fluoroscopy table, the lateral viewing duodenum scope was inserted into the oropharynx and passed into the esophagus.  It was advanced down the esophagus and then into the stomach.  No obvious lesions were seen in the stomach.  The duodenum was then entered and no lesions were seen in the duodenum.  The papilla of Vater was located and selective cannulation was achieved on initial passage of the guidewire and sphincterotome.  Contrast was injected into the biliary tree and I did not see any obvious stones on the initial injection, but because of the findings on her intraoperative cholangiogram we went ahead and locked the guidewire and positioned the sphincterotome appropriately and performed a sphincterotomy.  Good bile drainage occurred.  With the sphincterotomy, I did see some flecks of gravel-appearing material come out.  I  then removed the sphincterotome leaving the guidewire in place in the proximal biliary tree and advanced a balloon up into the proximal biliary tree in the region of the common hepatic duct.  The balloon was inflated to 11 mm and the duct was swept with some little gravel noted. There were no large stones or filling defects seen.  Inspection of the duct after this did not reveal any stones.  The scope was removed.  Of note, when the scope was cleaned a 5-mm stone was found lodged in the channel which was obviously removed during this procedure.  The patient tolerated the procedure well without complications.  IMPRESSION:  Choledocholithiasis status post sphincterotomy with balloon pull-through of an 11- to 12-mm balloon.          ______________________________ Graylin Shiver, M.D.     SFG/MEDQ  D:  01/24/2011  T:  01/25/2011  Job:  [id number]  cc:   Ollen Gross. Vernell Morgans, M.D.  Electronically Signed by Herbert Moors MD on 01/30/2011 04:38:54 PM

## 2011-02-20 NOTE — Discharge Summary (Signed)
  Susan Grimes, Susan Grimes                 ACCOUNT NO.:  [id number]  MEDICAL RECORD NO.:  [id number]           PATIENT TYPE:  O  LOCATION:  5121                         FACILITY:  [facility]  PHYSICIAN:  Ollen Gross. Vernell Morgans, M.D. DATE OF BIRTH:  1946-10-14  DATE OF ADMISSION:  01/23/2011 DATE OF DISCHARGE:  01/25/2011                              DISCHARGE SUMMARY   Susan Grimes is a 64 year old female, who was brought in for gallstones. She underwent a laparoscopic cholecystectomy on March 27 and tolerated this well.  Her intraoperative cholangiogram showed retained common duct stone that we were unable to get through the cystic duct.  The surgery was completed and GI was consulted.  On March 28, she underwent ERCP successfully.  She tolerated this well.  On March 29, her pain was under care control.  She was tolerating the diet and she was ready for discharge home.  DIET:  As tolerated.  ACTIVITIES:  No heavy lifting.  She was to resume her home meds and she was given a prescription for pain medicine.  FOLLOWUP:  With Dr. Carolynne Edouard in 2 weeks.  FINAL DIAGNOSIS:  Gallstones with retained common duct stone and she is discharged home.     Ollen Gross. Vernell Morgans, M.D.     PST/MEDQ  D:  02/09/2011  T:  02/09/2011  Job:  [id number]  Electronically Signed by Chevis Pretty III M.D. on 02/20/2011 09:23:25 AM

## 2011-11-28 DIAGNOSIS — IMO0002 Reserved for concepts with insufficient information to code with codable children: Secondary | ICD-10-CM | POA: Diagnosis not present

## 2011-11-28 DIAGNOSIS — G894 Chronic pain syndrome: Secondary | ICD-10-CM | POA: Diagnosis not present

## 2011-11-28 DIAGNOSIS — M47817 Spondylosis without myelopathy or radiculopathy, lumbosacral region: Secondary | ICD-10-CM | POA: Diagnosis not present

## 2012-02-05 DIAGNOSIS — M79609 Pain in unspecified limb: Secondary | ICD-10-CM | POA: Diagnosis not present

## 2012-02-14 DIAGNOSIS — G894 Chronic pain syndrome: Secondary | ICD-10-CM | POA: Diagnosis not present

## 2012-02-14 DIAGNOSIS — IMO0002 Reserved for concepts with insufficient information to code with codable children: Secondary | ICD-10-CM | POA: Diagnosis not present

## 2012-02-14 DIAGNOSIS — M47817 Spondylosis without myelopathy or radiculopathy, lumbosacral region: Secondary | ICD-10-CM | POA: Diagnosis not present

## 2012-02-20 DIAGNOSIS — L219 Seborrheic dermatitis, unspecified: Secondary | ICD-10-CM | POA: Diagnosis not present

## 2012-02-20 DIAGNOSIS — L739 Follicular disorder, unspecified: Secondary | ICD-10-CM | POA: Diagnosis not present

## 2012-02-29 DIAGNOSIS — Z8262 Family history of osteoporosis: Secondary | ICD-10-CM | POA: Diagnosis not present

## 2012-02-29 DIAGNOSIS — Z1231 Encounter for screening mammogram for malignant neoplasm of breast: Secondary | ICD-10-CM | POA: Diagnosis not present

## 2012-02-29 DIAGNOSIS — IMO0002 Reserved for concepts with insufficient information to code with codable children: Secondary | ICD-10-CM | POA: Diagnosis not present

## 2012-02-29 DIAGNOSIS — M810 Age-related osteoporosis without current pathological fracture: Secondary | ICD-10-CM | POA: Diagnosis not present

## 2012-03-12 DIAGNOSIS — M810 Age-related osteoporosis without current pathological fracture: Secondary | ICD-10-CM | POA: Diagnosis not present

## 2012-04-10 DIAGNOSIS — G894 Chronic pain syndrome: Secondary | ICD-10-CM | POA: Diagnosis not present

## 2012-04-10 DIAGNOSIS — M159 Polyosteoarthritis, unspecified: Secondary | ICD-10-CM | POA: Diagnosis not present

## 2012-04-10 DIAGNOSIS — M47817 Spondylosis without myelopathy or radiculopathy, lumbosacral region: Secondary | ICD-10-CM | POA: Diagnosis not present

## 2012-04-24 ENCOUNTER — Emergency Department (HOSPITAL_BASED_OUTPATIENT_CLINIC_OR_DEPARTMENT_OTHER): Payer: Medicare Other

## 2012-04-24 ENCOUNTER — Emergency Department (HOSPITAL_BASED_OUTPATIENT_CLINIC_OR_DEPARTMENT_OTHER)
Admission: EM | Admit: 2012-04-24 | Discharge: 2012-04-24 | Disposition: A | Discharge status: Home / Self Care | Payer: Medicare Other | Attending: Emergency Medicine | Admitting: Emergency Medicine

## 2012-04-24 ENCOUNTER — Encounter (HOSPITAL_BASED_OUTPATIENT_CLINIC_OR_DEPARTMENT_OTHER): Payer: Self-pay | Admitting: *Deleted

## 2012-04-24 DIAGNOSIS — R197 Diarrhea, unspecified: Secondary | ICD-10-CM

## 2012-04-24 DIAGNOSIS — Z9089 Acquired absence of other organs: Secondary | ICD-10-CM | POA: Insufficient documentation

## 2012-04-24 DIAGNOSIS — R109 Unspecified abdominal pain: Secondary | ICD-10-CM | POA: Diagnosis not present

## 2012-04-24 DIAGNOSIS — N390 Urinary tract infection, site not specified: Secondary | ICD-10-CM

## 2012-04-24 DIAGNOSIS — R112 Nausea with vomiting, unspecified: Secondary | ICD-10-CM

## 2012-04-24 DIAGNOSIS — R6889 Other general symptoms and signs: Secondary | ICD-10-CM | POA: Diagnosis not present

## 2012-04-24 DIAGNOSIS — R509 Fever, unspecified: Secondary | ICD-10-CM | POA: Diagnosis not present

## 2012-04-24 HISTORY — DX: Age-related osteoporosis without current pathological fracture: M81.0

## 2012-04-24 HISTORY — DX: Major depressive disorder, single episode, unspecified: F32.9

## 2012-04-24 HISTORY — DX: Depression, unspecified: F32.A

## 2012-04-24 HISTORY — DX: Anxiety disorder, unspecified: F41.9

## 2012-04-24 HISTORY — DX: Other chronic pain: G89.29

## 2012-04-24 HISTORY — DX: Low back pain, unspecified: M54.50

## 2012-04-24 HISTORY — DX: Low back pain: M54.5

## 2012-04-24 HISTORY — DX: Dermatitis, unspecified: L30.9

## 2012-04-24 HISTORY — DX: Diaphragmatic hernia without obstruction or gangrene: K44.9

## 2012-04-24 LAB — CBC WITH DIFFERENTIAL/PLATELET
Basophils Absolute: 0 10*3/uL (ref 0.0–0.1)
Basophils Relative: 0 % (ref 0–1)
Lymphocytes Relative: 22 % (ref 12–46)
MCHC: 34.8 g/dL (ref 30.0–36.0)
Neutro Abs: 7.3 10*3/uL (ref 1.7–7.7)
Neutrophils Relative %: 69 % (ref 43–77)
Platelets: 292 10*3/uL (ref 150–400)
RDW: 12.5 % (ref 11.5–15.5)
WBC: 10.6 10*3/uL — ABNORMAL HIGH (ref 4.0–10.5)

## 2012-04-24 LAB — URINE MICROSCOPIC-ADD ON

## 2012-04-24 LAB — URINALYSIS, ROUTINE W REFLEX MICROSCOPIC
Ketones, ur: 15 mg/dL — AB
Nitrite: NEGATIVE
Specific Gravity, Urine: 1.018 (ref 1.005–1.030)
pH: 5.5 (ref 5.0–8.0)

## 2012-04-24 LAB — BASIC METABOLIC PANEL
CO2: 28 mEq/L (ref 19–32)
Chloride: 98 mEq/L (ref 96–112)
Creatinine, Ser: 0.8 mg/dL (ref 0.50–1.10)
GFR calc Af Amer: 86 mL/min — ABNORMAL LOW (ref >90)
Potassium: 3.6 mEq/L (ref 3.5–5.1)
Sodium: 138 mEq/L (ref 135–145)

## 2012-04-24 IMAGING — CR DG ABDOMEN ACUTE W/ 1V CHEST
3 series · 3 of 3 positions shown · non-contrast
Comparison: Chest [DATE]

CLINICAL DATA: Abdominal cramping, nausea, vomiting, fever.

ACUTE ABDOMEN SERIES (ABDOMEN 2 VIEW & CHEST 1 VIEW)

[w chest pa]
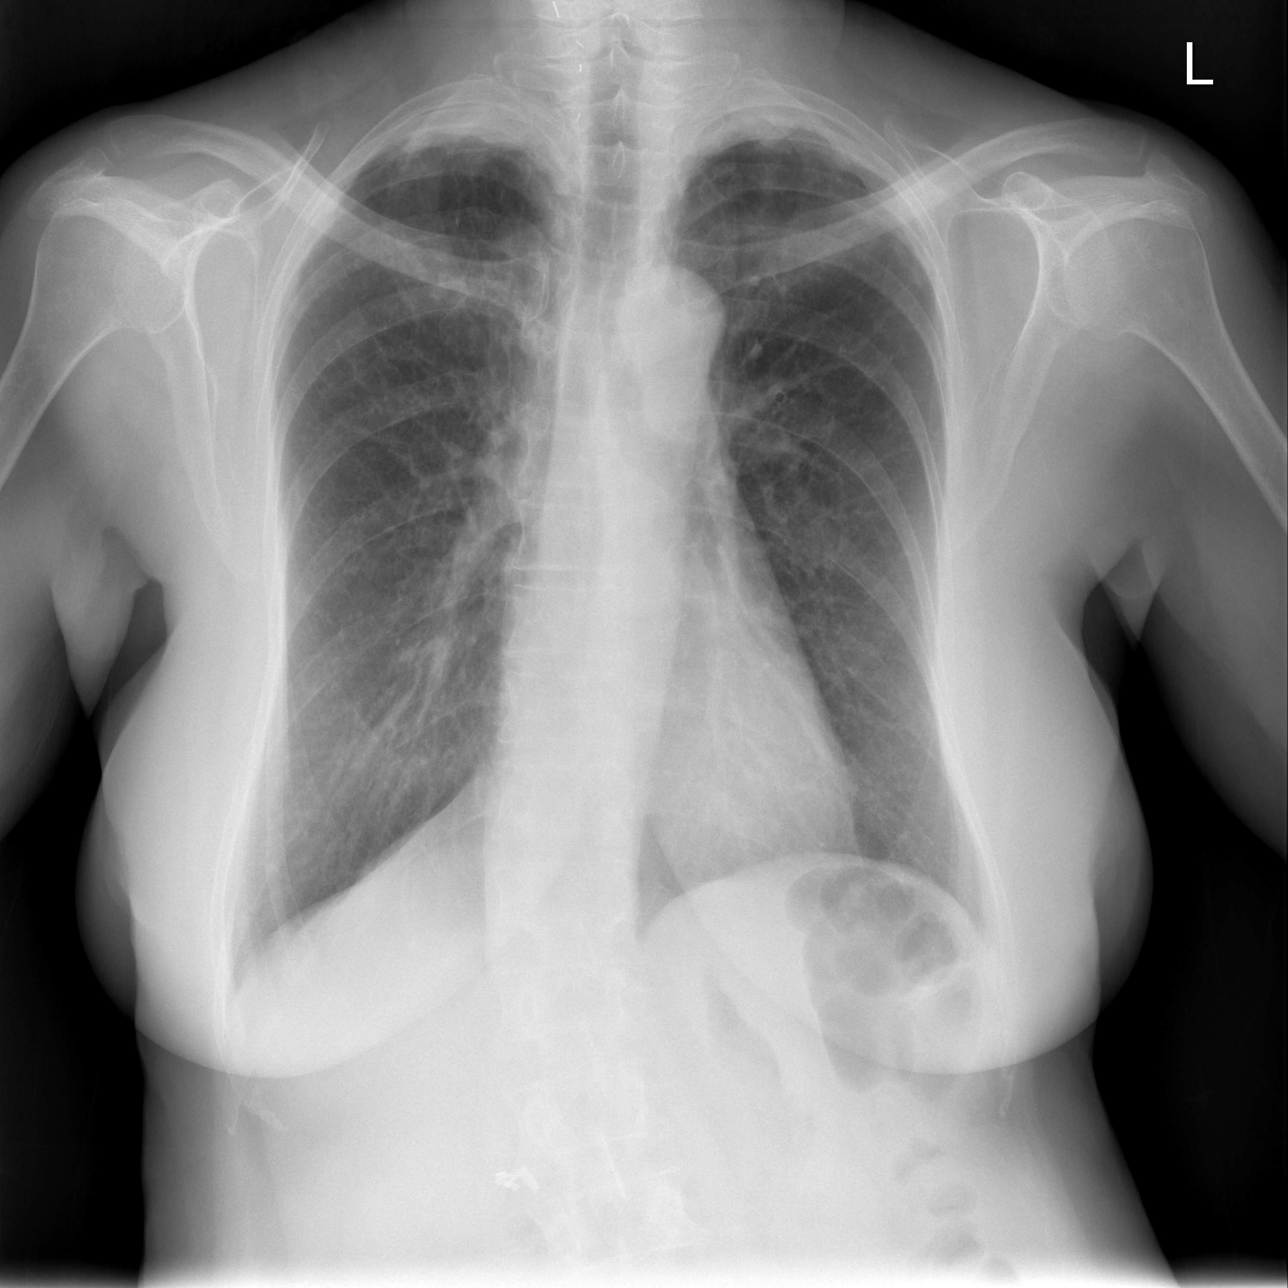

[w abdomen upright]
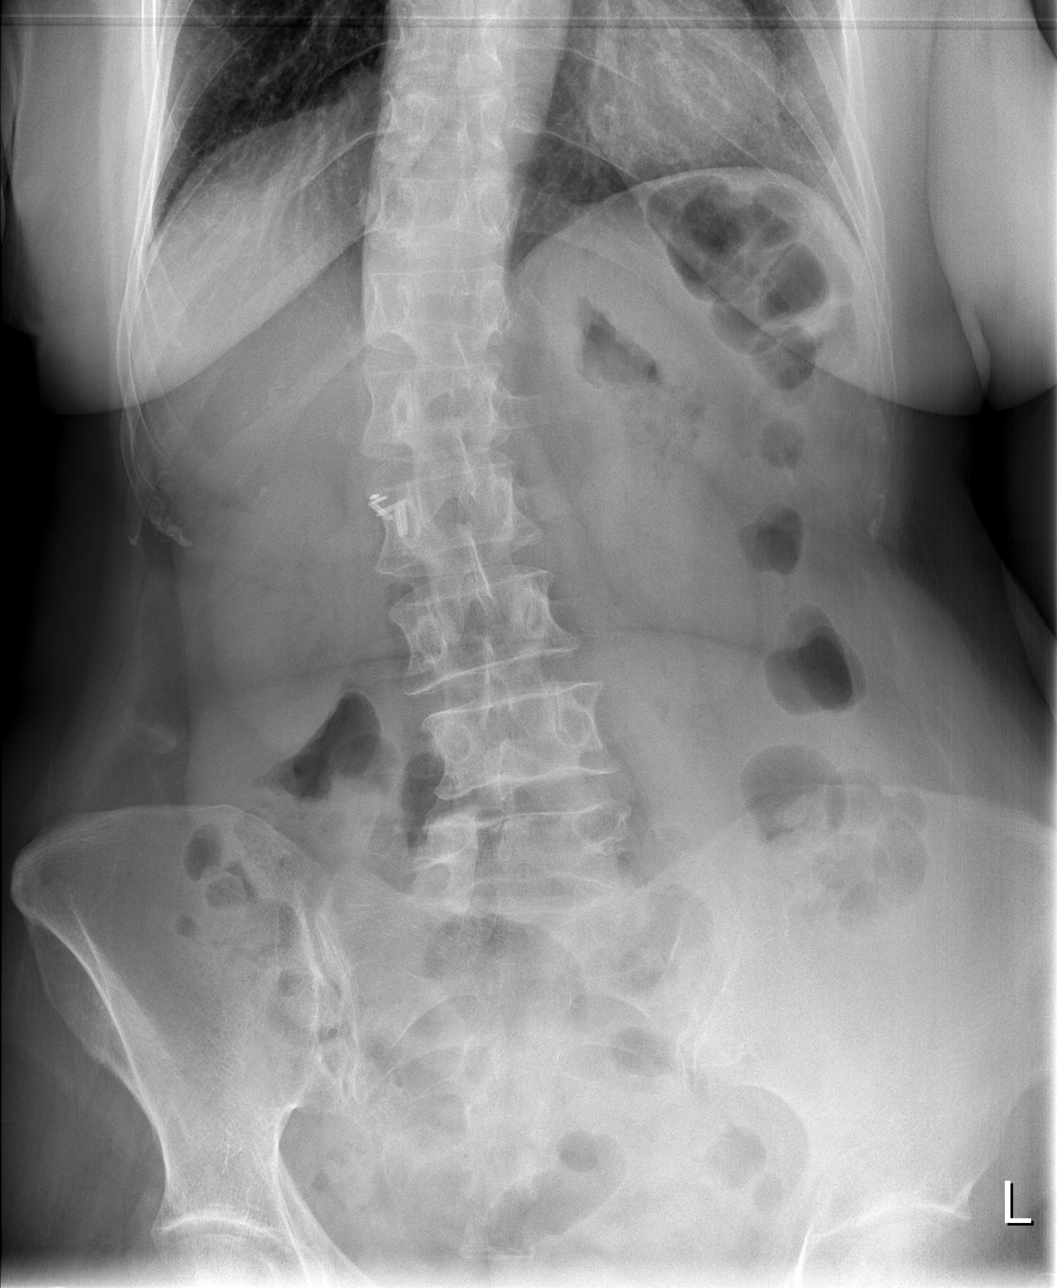

[t abdomen supine]
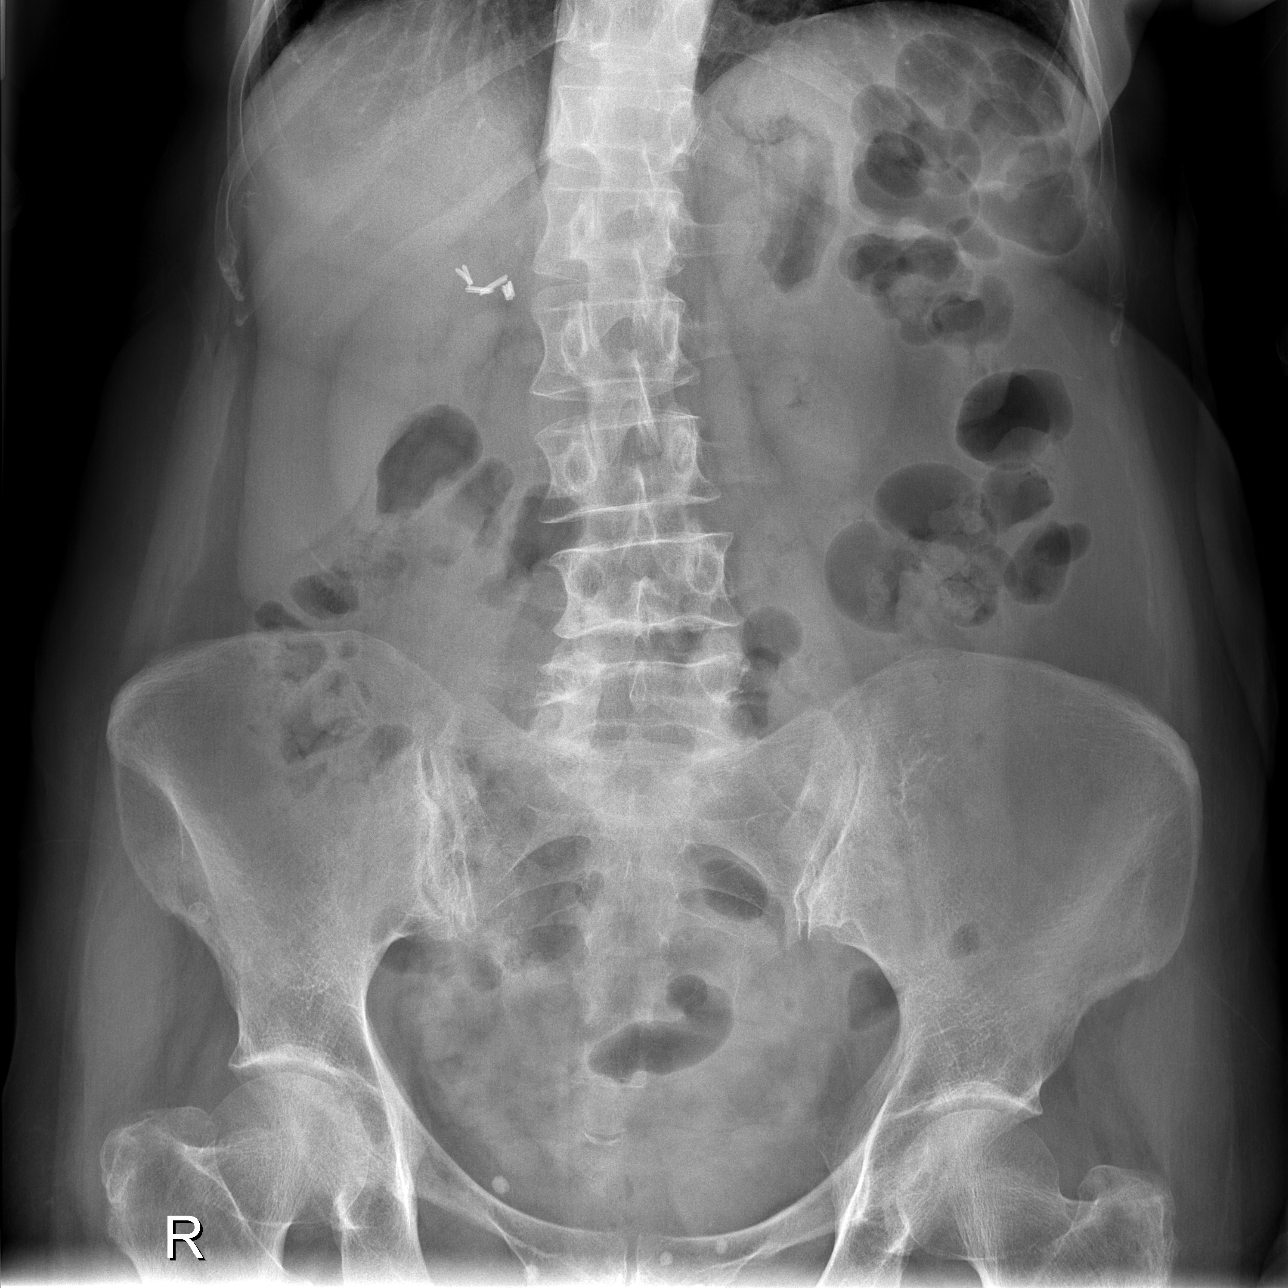

[3 of 3 positions shown; findings below may reference images not displayed]

FINDINGS: Mild hyperinflation with scattered fibrosis suggesting
emphysematous changes.  Bilateral apical pleural thickening.
Normal heart size and pulmonary vascularity.  No focal airspace
consolidation.  No blunting of costophrenic angles.  No
pneumothorax.  Stable appearance of the chest since previous study.

Scattered gas and stool in the colon.  No small or large bowel
distension.  No free intra-abdominal air.  No abnormal air fluid
levels.  Calcified phleboliths in the pelvis.  Surgical clips in
the right upper quadrant.  No radiopaque stones.  Mild
thoracolumbar scoliosis and degenerative change.
IMPRESSION: Emphysematous changes and scattered fibrosis in the lungs.  No
evidence of active pulmonary disease.  Nonobstructive bowel gas
pattern.

## 2012-04-24 MED ORDER — NITROFURANTOIN MONOHYD MACRO 100 MG PO CAPS
100.0000 mg | ORAL_CAPSULE | Freq: Once | ORAL | Status: AC
  Administered 2012-04-24: 100 mg via ORAL
  Filled 2012-04-24: qty 1

## 2012-04-24 MED ORDER — SODIUM CHLORIDE 0.9 % IV SOLN: Freq: Once | INTRAVENOUS | Status: DC

## 2012-04-24 MED ORDER — KETOROLAC TROMETHAMINE 30 MG/ML IJ SOLN
30.0000 mg | Freq: Once | INTRAMUSCULAR | Status: AC
  Administered 2012-04-24: 30 mg via INTRAVENOUS
  Filled 2012-04-24: qty 1

## 2012-04-24 MED ORDER — NITROFURANTOIN MONOHYD MACRO 100 MG PO CAPS: 100.0000 mg | ORAL_CAPSULE | Freq: Two times a day (BID) | ORAL | Status: AC

## 2012-04-24 MED ORDER — SODIUM CHLORIDE 0.9 % IV BOLUS (SEPSIS)
1000.0000 mL | Freq: Once | INTRAVENOUS | Status: AC
  Administered 2012-04-24: 1000 mL via INTRAVENOUS

## 2012-04-24 MED ORDER — METOCLOPRAMIDE HCL 5 MG/ML IJ SOLN
10.0000 mg | Freq: Once | INTRAMUSCULAR | Status: DC
  Filled 2012-04-24: qty 2

## 2012-04-24 MED ORDER — PROMETHAZINE HCL 12.5 MG RE SUPP: 12.5000 mg | Freq: Four times a day (QID) | RECTAL | Status: DC | PRN

## 2012-04-24 MED ORDER — ONDANSETRON HCL 4 MG/2ML IJ SOLN
4.0000 mg | Freq: Once | INTRAMUSCULAR | Status: AC
  Administered 2012-04-24: 4 mg via INTRAVENOUS
  Filled 2012-04-24: qty 2

## 2012-04-24 NOTE — ED Notes (Signed)
EMS transport from home with c/o fever with n/v since 0500

## 2012-04-24 NOTE — Discharge Instructions (Signed)
B.R.A.T. Diet Your doctor has recommended the B.R.A.T. diet for you or your child until the condition improves. This is often used to help control diarrhea and vomiting symptoms. If you or your child can tolerate clear liquids, you may have:  Bananas.   Rice.   Applesauce.   Toast (and other simple starches such as crackers, potatoes, noodles).  Be sure to avoid dairy products, meats, and fatty foods until symptoms are better. Fruit juices such as apple, grape, and prune juice can make diarrhea worse. Avoid these. Continue this diet for 2 days or as instructed by your caregiver. Document Released: 10/15/2005 Document Revised: 10/04/2011 Document Reviewed: 04/03/2007 [employer] Patient Information 2012 [employer]. 

## 2012-04-24 NOTE — ED Notes (Signed)
Pt reports N/V/D since yesterday at 0500. Pt found to be afebrile upon arrival.

## 2012-04-24 NOTE — ED Provider Notes (Signed)
History     CSN: [id number]  Arrival date & time 04/24/12  4098   First MD Initiated Contact with Patient 04/24/12 0242      Chief Complaint  Patient presents with  . Fever    (Consider location/radiation/quality/duration/timing/severity/associated sxs/prior treatment) Patient is a 66 y.o. female presenting with vomiting. The history is provided by the patient.  Emesis  This is a new problem. The current episode started 12 to 24 hours ago. The problem occurs 5 to 10 times per day. The problem has not changed since onset.There has been no fever. Associated symptoms include diarrhea. Pertinent negatives include no chills. Associated symptoms comments: Has felt warm and currently feels warm per patient but no fever. Risk factors: none.  Now has diarrhea and cramps after taking a laxative this evening.    Past Medical History  Diagnosis Date  . Chronic pain     Past Surgical History  Procedure Date  . Back surgery   . Cholecystectomy     No family history on file.  History  Substance Use Topics  . Smoking status: Not on file  . Smokeless tobacco: Not on file  . Alcohol Use:     OB History    Grav Para Term Preterm Abortions TAB SAB Ect Mult Living                  Review of Systems  Constitutional: Negative for chills.  Gastrointestinal: Positive for vomiting and diarrhea.  All other systems reviewed and are negative.    Allergies  Review of patient's allergies indicates no known allergies.  Home Medications   Current Outpatient Rx  Name Route Sig Dispense Refill  . XANAX PO Oral Take by mouth.    . METHADONE HCL PO Oral Take by mouth.      BP 146/92  Pulse 97  Temp 98.1 F (36.7 C) (Oral)  Resp 18  SpO2 100%  Physical Exam  Constitutional: She appears well-developed and well-nourished. No distress.  HENT:  Head: Normocephalic and atraumatic.  Mouth/Throat: Oropharynx is clear and moist.  Eyes: Conjunctivae are normal. Pupils are equal, round,  and reactive to light.  Neck: Normal range of motion. Neck supple.  Cardiovascular: Normal rate and regular rhythm.   Pulmonary/Chest: Effort normal and breath sounds normal.  Abdominal: Soft. Bowel sounds are normal. There is no tenderness. There is no rebound and no guarding.  Musculoskeletal: Normal range of motion. She exhibits no edema.  Lymphadenopathy:    She has no cervical adenopathy.  Skin: Skin is warm and dry.  Psychiatric: She has a normal mood and affect.    ED Course  Procedures (including critical care time)   Labs Reviewed  CBC WITH DIFFERENTIAL  BASIC METABOLIC PANEL  URINALYSIS, ROUTINE W REFLEX MICROSCOPIC  URINE CULTURE   No results found.   No diagnosis found.    MDM  Exam benign.  No indication for advanced imaging will give antiemetics and rx for UTI.  Follow up with your family doctor return for worsening symptoms       Georgena Weisheit K Olive Motyka-Rasch, MD 04/24/12 [phone]

## 2012-04-25 LAB — URINE CULTURE: Culture  Setup Time: 201306270716

## 2012-05-20 ENCOUNTER — Encounter: Payer: Self-pay | Admitting: Physician Assistant

## 2012-05-23 DIAGNOSIS — R110 Nausea: Secondary | ICD-10-CM | POA: Diagnosis not present

## 2012-05-23 DIAGNOSIS — K5289 Other specified noninfective gastroenteritis and colitis: Secondary | ICD-10-CM | POA: Diagnosis not present

## 2012-06-05 DIAGNOSIS — M4120 Other idiopathic scoliosis, site unspecified: Secondary | ICD-10-CM | POA: Diagnosis not present

## 2012-06-05 DIAGNOSIS — G894 Chronic pain syndrome: Secondary | ICD-10-CM | POA: Diagnosis not present

## 2012-06-05 DIAGNOSIS — M47817 Spondylosis without myelopathy or radiculopathy, lumbosacral region: Secondary | ICD-10-CM | POA: Diagnosis not present

## 2012-07-31 DIAGNOSIS — M159 Polyosteoarthritis, unspecified: Secondary | ICD-10-CM | POA: Diagnosis not present

## 2012-07-31 DIAGNOSIS — M47817 Spondylosis without myelopathy or radiculopathy, lumbosacral region: Secondary | ICD-10-CM | POA: Diagnosis not present

## 2012-07-31 DIAGNOSIS — M4120 Other idiopathic scoliosis, site unspecified: Secondary | ICD-10-CM | POA: Diagnosis not present

## 2012-07-31 DIAGNOSIS — G894 Chronic pain syndrome: Secondary | ICD-10-CM | POA: Diagnosis not present

## 2012-08-08 DIAGNOSIS — M545 Low back pain: Secondary | ICD-10-CM | POA: Diagnosis not present

## 2012-08-08 DIAGNOSIS — S90129A Contusion of unspecified lesser toe(s) without damage to nail, initial encounter: Secondary | ICD-10-CM | POA: Diagnosis not present

## 2012-08-29 DIAGNOSIS — G894 Chronic pain syndrome: Secondary | ICD-10-CM | POA: Diagnosis not present

## 2012-08-29 DIAGNOSIS — IMO0002 Reserved for concepts with insufficient information to code with codable children: Secondary | ICD-10-CM | POA: Diagnosis not present

## 2012-08-29 DIAGNOSIS — M47817 Spondylosis without myelopathy or radiculopathy, lumbosacral region: Secondary | ICD-10-CM | POA: Diagnosis not present

## 2012-10-09 DIAGNOSIS — M47817 Spondylosis without myelopathy or radiculopathy, lumbosacral region: Secondary | ICD-10-CM | POA: Diagnosis not present

## 2012-10-09 DIAGNOSIS — G894 Chronic pain syndrome: Secondary | ICD-10-CM | POA: Diagnosis not present

## 2013-01-19 DIAGNOSIS — F329 Major depressive disorder, single episode, unspecified: Secondary | ICD-10-CM | POA: Diagnosis not present

## 2013-01-19 DIAGNOSIS — E559 Vitamin D deficiency, unspecified: Secondary | ICD-10-CM | POA: Diagnosis not present

## 2013-01-19 DIAGNOSIS — S32009A Unspecified fracture of unspecified lumbar vertebra, initial encounter for closed fracture: Secondary | ICD-10-CM | POA: Diagnosis not present

## 2013-01-19 DIAGNOSIS — M810 Age-related osteoporosis without current pathological fracture: Secondary | ICD-10-CM | POA: Diagnosis not present

## 2013-01-19 DIAGNOSIS — R209 Unspecified disturbances of skin sensation: Secondary | ICD-10-CM | POA: Diagnosis not present

## 2013-01-19 DIAGNOSIS — R350 Frequency of micturition: Secondary | ICD-10-CM | POA: Diagnosis not present

## 2013-01-22 DIAGNOSIS — R7989 Other specified abnormal findings of blood chemistry: Secondary | ICD-10-CM | POA: Diagnosis not present

## 2013-02-17 DIAGNOSIS — F329 Major depressive disorder, single episode, unspecified: Secondary | ICD-10-CM | POA: Diagnosis not present

## 2013-02-26 DIAGNOSIS — M4120 Other idiopathic scoliosis, site unspecified: Secondary | ICD-10-CM | POA: Diagnosis not present

## 2013-02-26 DIAGNOSIS — G894 Chronic pain syndrome: Secondary | ICD-10-CM | POA: Diagnosis not present

## 2013-02-26 DIAGNOSIS — M47817 Spondylosis without myelopathy or radiculopathy, lumbosacral region: Secondary | ICD-10-CM | POA: Diagnosis not present

## 2013-03-05 ENCOUNTER — Ambulatory Visit: Payer: Medicare Other | Admitting: Psychology

## 2013-03-31 DIAGNOSIS — N390 Urinary tract infection, site not specified: Secondary | ICD-10-CM | POA: Diagnosis not present

## 2013-03-31 DIAGNOSIS — R350 Frequency of micturition: Secondary | ICD-10-CM | POA: Diagnosis not present

## 2013-04-23 DIAGNOSIS — G894 Chronic pain syndrome: Secondary | ICD-10-CM | POA: Diagnosis not present

## 2013-04-23 DIAGNOSIS — M47817 Spondylosis without myelopathy or radiculopathy, lumbosacral region: Secondary | ICD-10-CM | POA: Diagnosis not present

## 2013-04-23 DIAGNOSIS — Z79899 Other long term (current) drug therapy: Secondary | ICD-10-CM | POA: Diagnosis not present

## 2013-04-23 DIAGNOSIS — M4120 Other idiopathic scoliosis, site unspecified: Secondary | ICD-10-CM | POA: Diagnosis not present

## 2013-04-29 DIAGNOSIS — N390 Urinary tract infection, site not specified: Secondary | ICD-10-CM | POA: Diagnosis not present

## 2013-04-29 DIAGNOSIS — R300 Dysuria: Secondary | ICD-10-CM | POA: Diagnosis not present

## 2013-04-29 DIAGNOSIS — N952 Postmenopausal atrophic vaginitis: Secondary | ICD-10-CM | POA: Diagnosis not present

## 2013-06-03 DIAGNOSIS — L219 Seborrheic dermatitis, unspecified: Secondary | ICD-10-CM | POA: Diagnosis not present

## 2013-06-03 DIAGNOSIS — L819 Disorder of pigmentation, unspecified: Secondary | ICD-10-CM | POA: Diagnosis not present

## 2013-06-03 DIAGNOSIS — D485 Neoplasm of uncertain behavior of skin: Secondary | ICD-10-CM | POA: Diagnosis not present

## 2013-06-03 DIAGNOSIS — L723 Sebaceous cyst: Secondary | ICD-10-CM | POA: Diagnosis not present

## 2013-06-18 DIAGNOSIS — M159 Polyosteoarthritis, unspecified: Secondary | ICD-10-CM | POA: Diagnosis not present

## 2013-06-18 DIAGNOSIS — M47817 Spondylosis without myelopathy or radiculopathy, lumbosacral region: Secondary | ICD-10-CM | POA: Diagnosis not present

## 2013-06-18 DIAGNOSIS — Z79899 Other long term (current) drug therapy: Secondary | ICD-10-CM | POA: Diagnosis not present

## 2013-06-18 DIAGNOSIS — G894 Chronic pain syndrome: Secondary | ICD-10-CM | POA: Diagnosis not present

## 2013-06-29 DIAGNOSIS — N289 Disorder of kidney and ureter, unspecified: Secondary | ICD-10-CM

## 2013-06-29 HISTORY — DX: Disorder of kidney and ureter, unspecified: N28.9

## 2013-08-19 DIAGNOSIS — M810 Age-related osteoporosis without current pathological fracture: Secondary | ICD-10-CM | POA: Diagnosis not present

## 2013-08-19 DIAGNOSIS — Z136 Encounter for screening for cardiovascular disorders: Secondary | ICD-10-CM | POA: Diagnosis not present

## 2013-08-19 DIAGNOSIS — F329 Major depressive disorder, single episode, unspecified: Secondary | ICD-10-CM | POA: Diagnosis not present

## 2013-09-02 DIAGNOSIS — E785 Hyperlipidemia, unspecified: Secondary | ICD-10-CM | POA: Diagnosis not present

## 2013-09-02 DIAGNOSIS — Z8249 Family history of ischemic heart disease and other diseases of the circulatory system: Secondary | ICD-10-CM | POA: Diagnosis not present

## 2013-11-10 DIAGNOSIS — G894 Chronic pain syndrome: Secondary | ICD-10-CM | POA: Diagnosis not present

## 2013-11-10 DIAGNOSIS — Z79899 Other long term (current) drug therapy: Secondary | ICD-10-CM | POA: Diagnosis not present

## 2013-11-10 DIAGNOSIS — M47817 Spondylosis without myelopathy or radiculopathy, lumbosacral region: Secondary | ICD-10-CM | POA: Diagnosis not present

## 2014-02-17 DIAGNOSIS — F329 Major depressive disorder, single episode, unspecified: Secondary | ICD-10-CM | POA: Diagnosis not present

## 2014-02-17 DIAGNOSIS — R143 Flatulence: Secondary | ICD-10-CM | POA: Diagnosis not present

## 2014-02-17 DIAGNOSIS — J309 Allergic rhinitis, unspecified: Secondary | ICD-10-CM | POA: Diagnosis not present

## 2014-02-17 DIAGNOSIS — K219 Gastro-esophageal reflux disease without esophagitis: Secondary | ICD-10-CM | POA: Diagnosis not present

## 2014-02-17 DIAGNOSIS — M810 Age-related osteoporosis without current pathological fracture: Secondary | ICD-10-CM | POA: Diagnosis not present

## 2014-02-17 DIAGNOSIS — R141 Gas pain: Secondary | ICD-10-CM | POA: Diagnosis not present

## 2014-02-17 DIAGNOSIS — E7800 Pure hypercholesterolemia, unspecified: Secondary | ICD-10-CM | POA: Diagnosis not present

## 2014-02-17 DIAGNOSIS — K5900 Constipation, unspecified: Secondary | ICD-10-CM | POA: Diagnosis not present

## 2014-02-18 DIAGNOSIS — G894 Chronic pain syndrome: Secondary | ICD-10-CM | POA: Diagnosis not present

## 2014-02-18 DIAGNOSIS — M4120 Other idiopathic scoliosis, site unspecified: Secondary | ICD-10-CM | POA: Diagnosis not present

## 2014-02-18 DIAGNOSIS — M47817 Spondylosis without myelopathy or radiculopathy, lumbosacral region: Secondary | ICD-10-CM | POA: Diagnosis not present

## 2014-02-18 DIAGNOSIS — M159 Polyosteoarthritis, unspecified: Secondary | ICD-10-CM | POA: Diagnosis not present

## 2014-05-04 ENCOUNTER — Encounter (HOSPITAL_COMMUNITY): Payer: Self-pay | Admitting: Emergency Medicine

## 2014-05-04 ENCOUNTER — Emergency Department (HOSPITAL_COMMUNITY)
Admission: EM | Admit: 2014-05-04 | Discharge: 2014-05-04 | Disposition: A | Discharge status: Home / Self Care | Payer: Medicare Other | Attending: Emergency Medicine | Admitting: Emergency Medicine

## 2014-05-04 ENCOUNTER — Emergency Department (HOSPITAL_COMMUNITY): Payer: Medicare Other

## 2014-05-04 DIAGNOSIS — F411 Generalized anxiety disorder: Secondary | ICD-10-CM | POA: Insufficient documentation

## 2014-05-04 DIAGNOSIS — F3289 Other specified depressive episodes: Secondary | ICD-10-CM | POA: Diagnosis not present

## 2014-05-04 DIAGNOSIS — R1110 Vomiting, unspecified: Secondary | ICD-10-CM | POA: Insufficient documentation

## 2014-05-04 DIAGNOSIS — K5900 Constipation, unspecified: Secondary | ICD-10-CM | POA: Diagnosis not present

## 2014-05-04 DIAGNOSIS — M810 Age-related osteoporosis without current pathological fracture: Secondary | ICD-10-CM | POA: Diagnosis not present

## 2014-05-04 DIAGNOSIS — Z872 Personal history of diseases of the skin and subcutaneous tissue: Secondary | ICD-10-CM | POA: Insufficient documentation

## 2014-05-04 DIAGNOSIS — N183 Chronic kidney disease, stage 3 unspecified: Secondary | ICD-10-CM | POA: Insufficient documentation

## 2014-05-04 DIAGNOSIS — R42 Dizziness and giddiness: Secondary | ICD-10-CM | POA: Diagnosis not present

## 2014-05-04 DIAGNOSIS — J449 Chronic obstructive pulmonary disease, unspecified: Secondary | ICD-10-CM | POA: Diagnosis not present

## 2014-05-04 DIAGNOSIS — N390 Urinary tract infection, site not specified: Secondary | ICD-10-CM | POA: Diagnosis not present

## 2014-05-04 DIAGNOSIS — G8929 Other chronic pain: Secondary | ICD-10-CM | POA: Insufficient documentation

## 2014-05-04 DIAGNOSIS — Z8719 Personal history of other diseases of the digestive system: Secondary | ICD-10-CM | POA: Insufficient documentation

## 2014-05-04 DIAGNOSIS — F329 Major depressive disorder, single episode, unspecified: Secondary | ICD-10-CM | POA: Diagnosis not present

## 2014-05-04 DIAGNOSIS — Z79899 Other long term (current) drug therapy: Secondary | ICD-10-CM | POA: Diagnosis not present

## 2014-05-04 DIAGNOSIS — E860 Dehydration: Secondary | ICD-10-CM | POA: Diagnosis not present

## 2014-05-04 HISTORY — DX: Disorder of kidney and ureter, unspecified: N28.9

## 2014-05-04 LAB — URINALYSIS, ROUTINE W REFLEX MICROSCOPIC
Glucose, UA: NEGATIVE mg/dL
Hgb urine dipstick: NEGATIVE
Ketones, ur: 15 mg/dL — AB
Nitrite: NEGATIVE
Protein, ur: 30 mg/dL — AB
SPECIFIC GRAVITY, URINE: 1.029 (ref 1.005–1.030)
UROBILINOGEN UA: 0.2 mg/dL (ref 0.0–1.0)
pH: 5.5 (ref 5.0–8.0)

## 2014-05-04 LAB — CBC WITH DIFFERENTIAL/PLATELET
Basophils Absolute: 0 10*3/uL (ref 0.0–0.1)
Basophils Relative: 0 % (ref 0–1)
EOS ABS: 0 10*3/uL (ref 0.0–0.7)
Eosinophils Relative: 0 % (ref 0–5)
HCT: 39.9 % (ref 36.0–46.0)
Hemoglobin: 13.2 g/dL (ref 12.0–15.0)
LYMPHS ABS: 2.2 10*3/uL (ref 0.7–4.0)
LYMPHS PCT: 22 % (ref 12–46)
MCH: 30.1 pg (ref 26.0–34.0)
MCHC: 33.1 g/dL (ref 30.0–36.0)
MCV: 91.1 fL (ref 78.0–100.0)
Monocytes Absolute: 0.8 10*3/uL (ref 0.1–1.0)
Monocytes Relative: 8 % (ref 3–12)
NEUTROS ABS: 6.9 10*3/uL (ref 1.7–7.7)
NEUTROS PCT: 70 % (ref 43–77)
PLATELETS: 265 10*3/uL (ref 150–400)
RBC: 4.38 MIL/uL (ref 3.87–5.11)
RDW: 13.3 % (ref 11.5–15.5)
WBC: 9.9 10*3/uL (ref 4.0–10.5)

## 2014-05-04 LAB — COMPREHENSIVE METABOLIC PANEL
ALT: 7 U/L (ref 0–35)
AST: 16 U/L (ref 0–37)
Albumin: 4.3 g/dL (ref 3.5–5.2)
Alkaline Phosphatase: 53 U/L (ref 39–117)
Anion gap: 15 (ref 5–15)
BUN: 14 mg/dL (ref 6–23)
CO2: 24 meq/L (ref 19–32)
Calcium: 9.6 mg/dL (ref 8.4–10.5)
Chloride: 101 mEq/L (ref 96–112)
Creatinine, Ser: 0.87 mg/dL (ref 0.50–1.10)
GFR calc non Af Amer: 66 mL/min — ABNORMAL LOW (ref >90)
GFR, EST AFRICAN AMERICAN: 77 mL/min — AB (ref >90)
Glucose, Bld: 105 mg/dL — ABNORMAL HIGH (ref 70–99)
Potassium: 3.6 mEq/L — ABNORMAL LOW (ref 3.7–5.3)
SODIUM: 140 meq/L (ref 137–147)
TOTAL PROTEIN: 7.7 g/dL (ref 6.0–8.3)
Total Bilirubin: 0.7 mg/dL (ref 0.3–1.2)

## 2014-05-04 LAB — URINE MICROSCOPIC-ADD ON

## 2014-05-04 IMAGING — CR DG ABDOMEN ACUTE W/ 1V CHEST
3 series · 3 of 3 positions shown · non-contrast
Comparison: Chest x-ray dated [DATE]

CLINICAL DATA: Shortness of breath, abdominal pain with vomiting.

EXAM:
ACUTE ABDOMEN SERIES (ABDOMEN 2 VIEW & CHEST 1 VIEW)

[w chest pa]
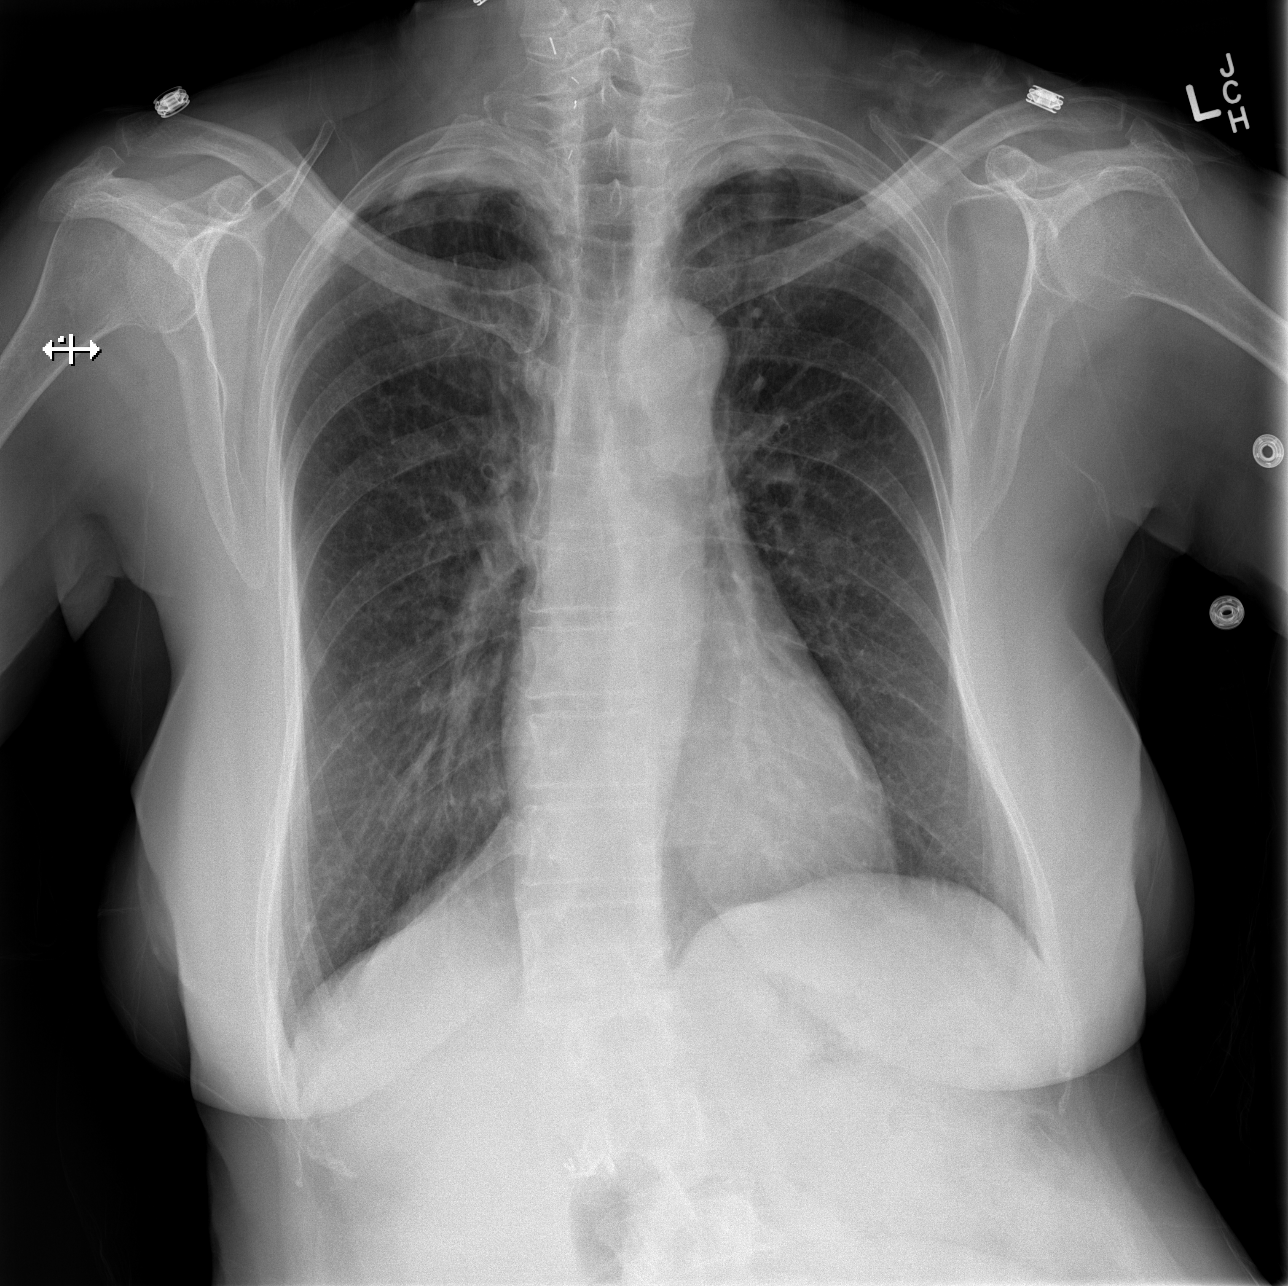

[w abdomen upright]
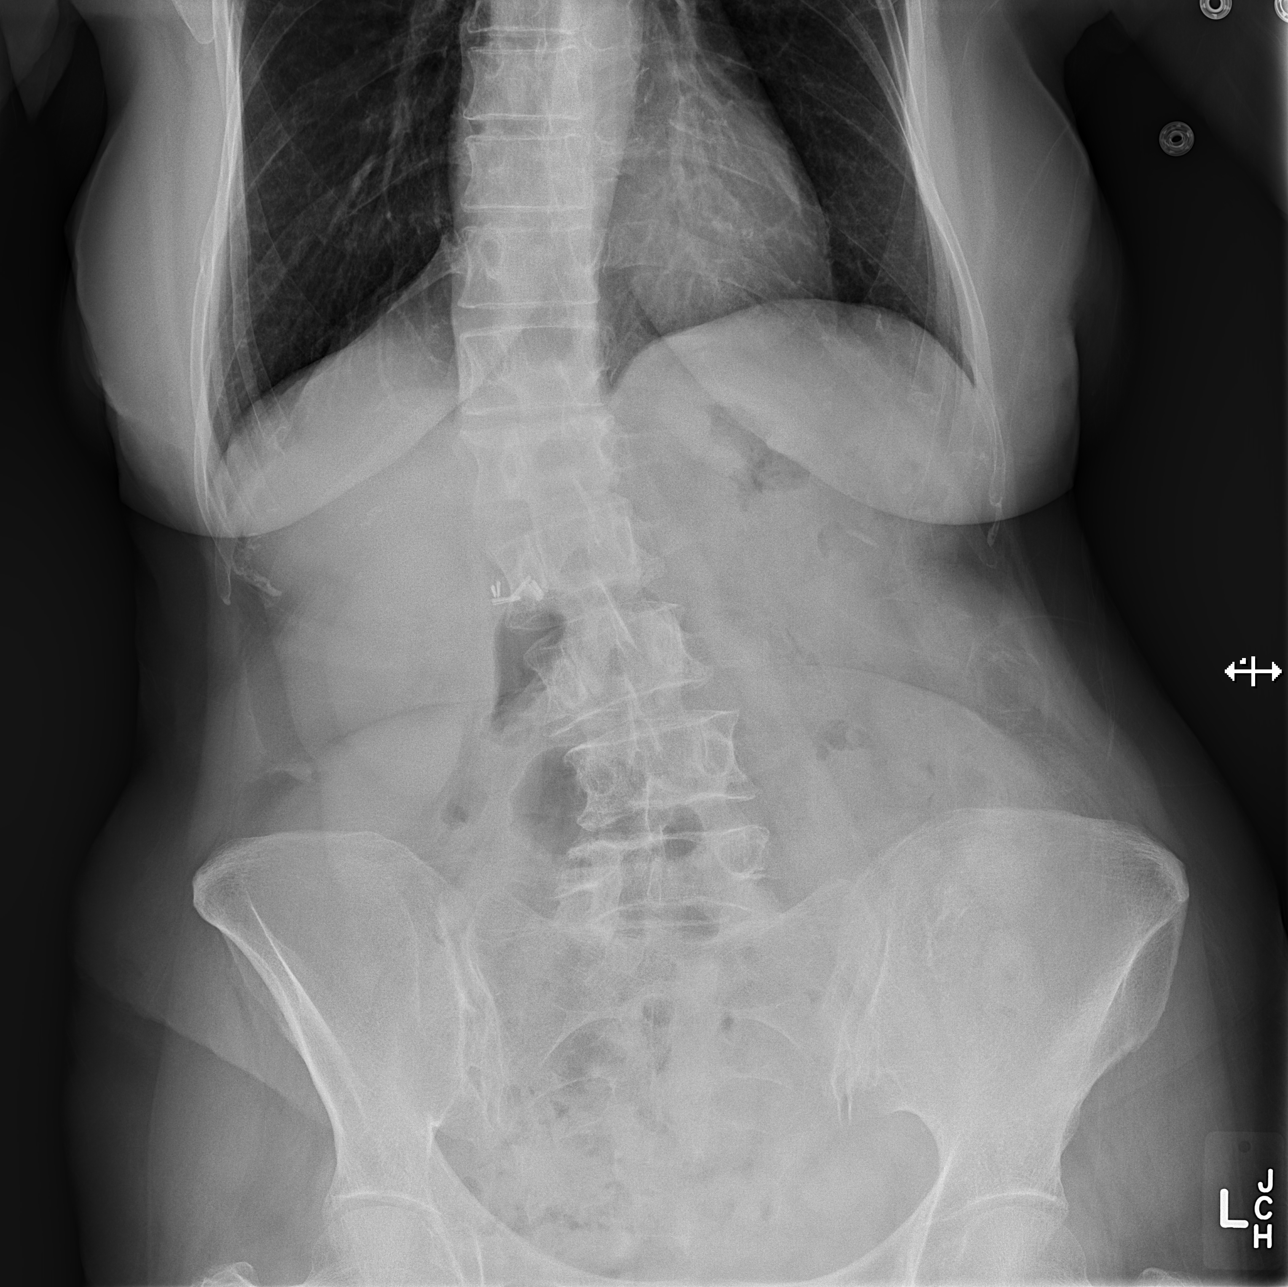

[t abdomen supine]
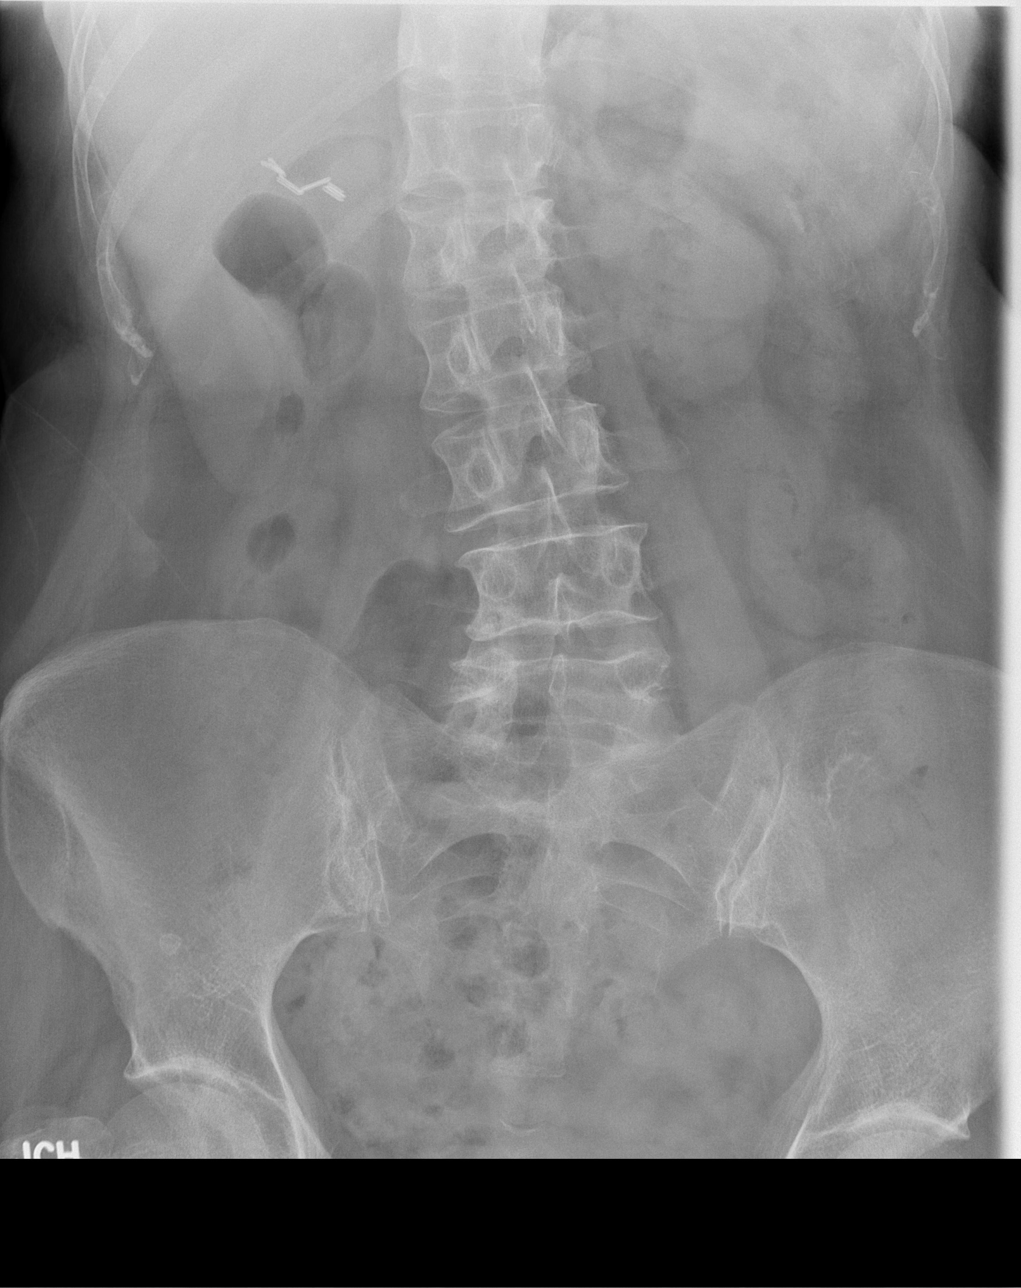

[3 of 3 positions shown; findings below may reference images not displayed]

FINDINGS: The lungs remain hyperinflated. There is no focal infiltrate. There
is stable apical pleural scarring bilaterally. The heart and
mediastinal structures are normal. There is no pleural effusion.

The bowel gas pattern is nonspecific. There is no evidence of ileus
nor obstruction. There surgical clips in the gallbladder fossa.
There are phleboliths within the pelvis. There is gentle dextro
curvature of the thoracolumbar spine.
IMPRESSION: 1. COPD with biapical scarring. There is no pneumonia nor other
acute cardiopulmonary abnormality.
2. There is no acute intra-abdominal abnormality.

## 2014-05-04 MED ORDER — MORPHINE SULFATE 4 MG/ML IJ SOLN
4.0000 mg | Freq: Once | INTRAMUSCULAR | Status: AC
  Administered 2014-05-04: 4 mg via INTRAVENOUS
  Filled 2014-05-04: qty 1

## 2014-05-04 MED ORDER — SODIUM CHLORIDE 0.9 % IV SOLN
1000.0000 mL | Freq: Once | INTRAVENOUS | Status: AC
  Administered 2014-05-04: 1000 mL via INTRAVENOUS

## 2014-05-04 MED ORDER — ONDANSETRON HCL 4 MG PO TABS: 4.0000 mg | ORAL_TABLET | Freq: Four times a day (QID) | ORAL | Status: DC

## 2014-05-04 MED ORDER — SODIUM CHLORIDE 0.9 % IV SOLN
1000.0000 mL | INTRAVENOUS | Status: DC
  Administered 2014-05-04: 1000 mL via INTRAVENOUS

## 2014-05-04 MED ORDER — ONDANSETRON HCL 4 MG/2ML IJ SOLN
4.0000 mg | Freq: Once | INTRAMUSCULAR | Status: AC
  Administered 2014-05-04: 4 mg via INTRAVENOUS
  Filled 2014-05-04: qty 2

## 2014-05-04 MED ORDER — ACETAMINOPHEN 325 MG PO TABS
650.0000 mg | ORAL_TABLET | Freq: Once | ORAL | Status: AC
  Administered 2014-05-04: 650 mg via ORAL
  Filled 2014-05-04: qty 2

## 2014-05-04 MED ORDER — DOCUSATE SODIUM 100 MG PO CAPS: 100.0000 mg | ORAL_CAPSULE | Freq: Two times a day (BID) | ORAL | Status: DC

## 2014-05-04 MED ORDER — CEPHALEXIN 500 MG PO CAPS: 500.0000 mg | ORAL_CAPSULE | Freq: Four times a day (QID) | ORAL | Status: DC

## 2014-05-04 MED ORDER — DEXTROSE 5 % IV SOLN
1.0000 g | Freq: Once | INTRAVENOUS | Status: AC
  Administered 2014-05-04: 1 g via INTRAVENOUS
  Filled 2014-05-04: qty 10

## 2014-05-04 NOTE — ED Notes (Signed)
Patient gave a UA Sample. However the sample was insuffient to send to lab.

## 2014-05-04 NOTE — ED Provider Notes (Signed)
CSN: [id number]     Arrival date & time 05/04/14  1320 History   First MD Initiated Contact with Patient 05/04/14 1412     Chief Complaint  Patient presents with  . Emesis  . Constipation    HPI Over the weekend, it started with general malaise.  Beginning of the weak she started having vomiting.  She vomited 4-5 times on Monday.  She developed a bad headache after the vomiting and has not been able to keep down a lot of liquids.  She last vomited this am at around 4 am.  She has been able to keep down some sips of fluid. Decreased bm, last bm was 6 days ago.  She has not taken  anything for her constipation. Pt has had similar symptoms in the past.  One time she had a uti.  She saw her doctor who told her to come to the ED for evaluation.  They were concerned that she was dehydrated.   Past Medical History  Diagnosis Date  . Chronic pain   . Eczema   . Anxiety   . Depression   . Hiatal hernia   . Osteoporosis   . Lumbar back pain   . Renal disorder 06/2013    CKD stage 3   Past Surgical History  Procedure Laterality Date  . Back surgery    . Cholecystectomy    . Tonsillectomy    . Thyroidectomy, partial    . Tubal ligation     No family history on file. History  Substance Use Topics  . Smoking status: Never Smoker   . Smokeless tobacco: Not on file  . Alcohol Use: No     Comment: social   OB History   Grav Para Term Preterm Abortions TAB SAB Ect Mult Living                 Review of Systems  Constitutional: Negative for fever.  Gastrointestinal: Positive for vomiting and constipation. Negative for diarrhea and abdominal distention.  Genitourinary: Negative for dysuria.  All other systems reviewed and are negative.     Allergies  Fentanyl  Home Medications   Prior to Admission medications   Medication Sig Start Date End Date Taking? Authorizing Provider  acetaminophen (TYLENOL) 500 MG tablet Take 1,000 mg by mouth every 6 (six) hours as needed for moderate  pain.   Yes Historical Provider, MD  alendronate (FOSAMAX) 70 MG tablet Take 70 mg by mouth every Sunday. Take with a full glass of water on an empty stomach.   Yes Historical Provider, MD  ALPRAZolam Duanne Moron) 0.5 MG tablet Take 0.5 mg by mouth 3 (three) times daily as needed for anxiety.   Yes Historical Provider, MD  cetirizine (ZYRTEC) 10 MG tablet Take 10 mg by mouth daily as needed for allergies.   Yes Historical Provider, MD  citalopram (CELEXA) 40 MG tablet Take 40 mg by mouth daily.   Yes Historical Provider, MD  clobetasol (TEMOVATE) 0.05 % external solution Apply 1 application topically 2 (two) times daily as needed (itching).   Yes Historical Provider, MD  methadone (DOLOPHINE) 10 MG tablet Take 10 mg by mouth every 12 (twelve) hours.   Yes Historical Provider, MD  omeprazole (PRILOSEC) 40 MG capsule Take 40 mg by mouth daily as needed (heart burn).   Yes Historical Provider, MD   BP 137/94  Pulse 88  Temp(Src) 98.4 F (36.9 C) (Oral)  Resp 18  SpO2 97% Physical Exam  Nursing note and  vitals reviewed. Constitutional: She appears well-developed and well-nourished. No distress.  HENT:  Head: Normocephalic and atraumatic.  Right Ear: External ear normal.  Left Ear: External ear normal.  Eyes: Conjunctivae are normal. Right eye exhibits no discharge. Left eye exhibits no discharge. No scleral icterus.  Neck: Neck supple. No tracheal deviation present.  Cardiovascular: Normal rate, regular rhythm and intact distal pulses.   Pulmonary/Chest: Effort normal and breath sounds normal. No stridor. No respiratory distress. She has no wheezes. She has no rales.  Abdominal: Soft. Bowel sounds are normal. She exhibits no distension. There is no tenderness. There is no rebound and no guarding.  Musculoskeletal: She exhibits no edema and no tenderness.  Neurological: She is alert. She has normal strength. No cranial nerve deficit (no facial droop, extraocular movements intact, no slurred speech)  or sensory deficit. She exhibits normal muscle tone. She displays no seizure activity. Coordination normal.  Skin: Skin is warm and dry. No rash noted.  Psychiatric: She has a normal mood and affect.    ED Course  Procedures (including critical care time) Labs Review Labs Reviewed  COMPREHENSIVE METABOLIC PANEL - Abnormal; Notable for the following:    Potassium 3.6 (*)    Glucose, Bld 105 (*)    GFR calc non Af Amer 66 (*)    GFR calc Af Amer 77 (*)    All other components within normal limits  URINALYSIS, ROUTINE W REFLEX MICROSCOPIC - Abnormal; Notable for the following:    Color, Urine AMBER (*)    APPearance CLOUDY (*)    Bilirubin Urine MODERATE (*)    Ketones, ur 15 (*)    Protein, ur 30 (*)    Leukocytes, UA MODERATE (*)    All other components within normal limits  CBC WITH DIFFERENTIAL  URINE MICROSCOPIC-ADD ON  LIPASE, BLOOD    Imaging Review Dg Abd Acute W/chest  05/04/2014   CLINICAL DATA:  Shortness of breath, abdominal pain with vomiting.  EXAM: ACUTE ABDOMEN SERIES (ABDOMEN 2 VIEW & CHEST 1 VIEW)  COMPARISON:  Chest x-ray dated April 24, 2012  FINDINGS: The lungs remain hyperinflated. There is no focal infiltrate. There is stable apical pleural scarring bilaterally. The heart and mediastinal structures are normal. There is no pleural effusion.  The bowel gas pattern is nonspecific. There is no evidence of ileus nor obstruction. There surgical clips in the gallbladder fossa. There are phleboliths within the pelvis. There is gentle dextro curvature of the thoracolumbar spine.  IMPRESSION: 1. COPD with biapical scarring. There is no pneumonia nor other acute cardiopulmonary abnormality. 2. There is no acute intra-abdominal abnormality.   Electronically Signed   By: David  Martinique   On: 05/04/2014 15:57   Medications  0.9 %  sodium chloride infusion (1,000 mLs Intravenous New Bag/Given 05/04/14 1510)    Followed by  0.9 %  sodium chloride infusion (1,000 mLs Intravenous New  Bag/Given 05/04/14 1510)  morphine 4 MG/ML injection 4 mg (not administered)  cefTRIAXone (ROCEPHIN) 1 g in dextrose 5 % 50 mL IVPB (not administered)  ondansetron (ZOFRAN) injection 4 mg (4 mg Intravenous Given 05/04/14 1512)  acetaminophen (TYLENOL) tablet 650 mg (650 mg Oral Given 05/04/14 1512)     MDM   Final diagnoses:  UTI (lower urinary tract infection)    No dehydration noted on her labs.  Afebrile with nl WBC.   Doubt obstruction, colitis, or other more serious infection.  UTI noted on UA.  Will dc home with abx and antiemetics.  DC home with stool softeners.   Dorie Rank, MD 05/04/14 [phone]

## 2014-05-04 NOTE — ED Notes (Signed)
Pt sent from PCP for dehydration from vomiting since yesterday morning and constipation. Pt last BM 6 days ago. Pt states being nauseated she was afraid to take a laxative.

## 2014-05-04 NOTE — Discharge Instructions (Signed)

## 2014-05-06 LAB — URINE CULTURE
COLONY COUNT: NO GROWTH
Culture: NO GROWTH

## 2014-06-01 DIAGNOSIS — R42 Dizziness and giddiness: Secondary | ICD-10-CM | POA: Diagnosis not present

## 2014-06-01 DIAGNOSIS — M810 Age-related osteoporosis without current pathological fracture: Secondary | ICD-10-CM | POA: Diagnosis not present

## 2014-06-01 DIAGNOSIS — F329 Major depressive disorder, single episode, unspecified: Secondary | ICD-10-CM | POA: Diagnosis not present

## 2014-06-15 DIAGNOSIS — R350 Frequency of micturition: Secondary | ICD-10-CM | POA: Diagnosis not present

## 2014-06-15 DIAGNOSIS — R209 Unspecified disturbances of skin sensation: Secondary | ICD-10-CM | POA: Diagnosis not present

## 2014-06-15 DIAGNOSIS — J329 Chronic sinusitis, unspecified: Secondary | ICD-10-CM | POA: Diagnosis not present

## 2014-07-02 ENCOUNTER — Ambulatory Visit (INDEPENDENT_AMBULATORY_CARE_PROVIDER_SITE_OTHER): Payer: Medicare Other | Admitting: Neurology

## 2014-07-02 ENCOUNTER — Encounter: Payer: Self-pay | Admitting: Neurology

## 2014-07-02 VITALS — BP 156/97 | HR 93 | Ht 64.5 in | Wt 135.0 lb

## 2014-07-02 DIAGNOSIS — M545 Low back pain, unspecified: Secondary | ICD-10-CM | POA: Diagnosis not present

## 2014-07-02 DIAGNOSIS — R202 Paresthesia of skin: Secondary | ICD-10-CM

## 2014-07-02 DIAGNOSIS — R209 Unspecified disturbances of skin sensation: Secondary | ICD-10-CM | POA: Diagnosis not present

## 2014-07-02 MED ORDER — NORTRIPTYLINE HCL 10 MG PO CAPS: ORAL_CAPSULE | ORAL | Status: DC

## 2014-07-02 NOTE — Progress Notes (Signed)
PATIENT: Susan Grimes DOB: [birth year]  HISTORICAL  Susan Grimes is a 68 years old right-handed female, accompanied by her daughter, Susan Grimes for evaluation of low back pain, bilateral feet paresthesia  She had a history of low back decompression surgery in 2003, which did help her low back pain, but she continued to have moderate to severe intermittent low back pain, shooting pain to her bilateral lower extremity, has been under pain management Dr. Nicholaus Bloom, over the years, she stayed on dose methadone 10 mg twice a day, there was some misunderstanding about the dosage of her methadone recently, she has stopped going to pain management, stopped taking methadone about 2 months ago, in July 2015.  Coincident with her methadone withdrawal, she began to notice worsening low back pain, radiating pain to bilateral lower extremity, worsening bilateral lower extremity paresthesia.  She reported that she has tried Neurontin, multiple other neuropathic pain medication in the past, without helping her  She denies bilateral upper extremity paresthesia    REVIEW OF SYSTEMS: Full 14 system review of systems performed and notable only for weight loss, fatigue, spinning sensation, shortness of breath, diarrhea, easy bruising, joint pain, runny nose.  ALLERGIES: Allergies  Allergen Reactions  . Asa [Aspirin] Other (See Comments)    Bruses, and thins her blood  . Fentanyl Nausea And Vomiting and Other (See Comments)    Patch---headache    HOME MEDICATIONS: Current Outpatient Prescriptions on File Prior to Visit  Medication Sig Dispense Refill  . acetaminophen (TYLENOL) 500 MG tablet Take 1,000 mg by mouth every 6 (six) hours as needed for moderate pain.      Marland Kitchen alendronate (FOSAMAX) 70 MG tablet Take 70 mg by mouth every Sunday. Take with a full glass of water on an empty stomach.      . cetirizine (ZYRTEC) 10 MG tablet Take 10 mg by mouth daily  as needed for allergies.      . citalopram (CELEXA) 40 MG tablet Take 40 mg by mouth daily.      Marland Kitchen docusate sodium (COLACE) 100 MG capsule Take 1 capsule (100 mg total) by mouth every 12 (twelve) hours.  20 capsule  0   No current facility-administered medications on file prior to visit.    PAST MEDICAL HISTORY: Past Medical History  Diagnosis Date  . Chronic pain   . Eczema   . Anxiety   . Depression   . Hiatal hernia   . Osteoporosis   . Lumbar back pain   . Renal disorder 06/2013    CKD stage 3    PAST SURGICAL HISTORY: Past Surgical History  Procedure Laterality Date  . Back surgery    . Cholecystectomy    . Tonsillectomy    . Thyroidectomy, partial    . Tubal ligation      FAMILY HISTORY: No family history on file.  SOCIAL HISTORY:  History   Social History  . Marital Status: Divorced    Spouse Name: N/A    Number of Children: 1  . Years of Education: 12   Occupational History  . 1    Social History Main Topics  . Smoking status: Never Smoker   . Smokeless tobacco: Never Used  . Alcohol Use: Yes     Comment: occ  . Drug Use: No  . Sexual Activity: Not on file   Other Topics Concern  . Not on file   Social History Narrative  Patient is single with one child.   Patient is right handed.   Patient has high school education.   Patient drinks 3 cups daily.     PHYSICAL EXAM   Filed Vitals:   07/02/14 1203  BP: 156/97  Pulse: 93  Height: 5' 4.5" (1.638 m)  Weight: 135 lb (61.236 kg)    Not recorded    Body mass index is 22.82 kg/(m^2).   Generalized: In no acute distress  Neck: Supple, no carotid bruits   Cardiac: Regular rate rhythm  Pulmonary: Clear to auscultation bilaterally  Musculoskeletal: No deformity  Neurological examination  Mentation: Alert oriented to time, place, history taking, and causual conversation  Cranial nerve II-XII: Pupils were equal round reactive to light. Extraocular movements were full.  Visual  field were full on confrontational test. Bilateral fundi were sharp.  Facial sensation and strength were normal. Hearing was intact to finger rubbing bilaterally. Uvula tongue midline.  Head turning and shoulder shrug and were normal and symmetric.Tongue protrusion into cheek strength was normal.  Motor: Normal tone, bulk and strength.  Sensory: Intact to fine touch, pinprick, preserved vibratory sensation, and proprioception at toes.  Coordination: Normal finger to nose, heel-to-shin bilaterally there was no truncal ataxia  Gait: Rising up from seated position without assistance, normal stance, without trunk ataxia, moderate stride, good arm swing, smooth turning, able to perform tiptoe, and heel walking with mild to moderate difficulty.   Romberg signs: Negative  Deep tendon reflexes: Brachioradialis 2/2, biceps 2/2, triceps 2/2, patellar 2/2, Achilles trace, plantar responses were flexor bilaterally.   DIAGNOSTIC DATA (LABS, IMAGING, TESTING) - I reviewed patient records, labs, notes, testing and imaging myself where available.  Lab Results  Component Value Date   WBC 9.9 05/04/2014   HGB 13.2 05/04/2014   HCT 39.9 05/04/2014   MCV 91.1 05/04/2014   PLT 265 05/04/2014      Component Value Date/Time   NA 140 05/04/2014 1448   K 3.6* 05/04/2014 1448   CL 101 05/04/2014 1448   CO2 24 05/04/2014 1448   GLUCOSE 105* 05/04/2014 1448   BUN 14 05/04/2014 1448   CREATININE 0.87 05/04/2014 1448   CALCIUM 9.6 05/04/2014 1448   PROT 7.7 05/04/2014 1448   ALBUMIN 4.3 05/04/2014 1448   AST 16 05/04/2014 1448   ALT 7 05/04/2014 1448   ALKPHOS 53 05/04/2014 1448   BILITOT 0.7 05/04/2014 1448   GFRNONAA 66* 05/04/2014 1448   GFRAA 77* 05/04/2014 1448    ASSESSMENT AND PLAN  Susan Grimes is a 68 y.o. female with past medical history of lumbar decompression surgery, presenting with persistent low back pain, worsening pain after stop taking methadone, worsening bilateral lower extremity paresthesia  1, differentiation  diagnosis including peripheral neuropathy vs. lumbar radiculopathy  2. MRI of lumbar spine  3. EMG nerve conduction study  4. Nortriptyline, 10 mg, titrating to 20 mg every night  Susan Grimes, M.D. Ph.D.  [facility] [address] [place], [place] [address] [phone]

## 2014-07-19 ENCOUNTER — Ambulatory Visit
Admission: RE | Admit: 2014-07-19 | Discharge: 2014-07-19 | Disposition: A | Discharge status: Home / Self Care | Payer: Medicare Other | Source: Ambulatory Visit | Attending: Neurology | Admitting: Neurology

## 2014-07-19 DIAGNOSIS — R209 Unspecified disturbances of skin sensation: Secondary | ICD-10-CM

## 2014-07-19 DIAGNOSIS — M545 Low back pain, unspecified: Secondary | ICD-10-CM | POA: Diagnosis not present

## 2014-07-19 DIAGNOSIS — R202 Paresthesia of skin: Secondary | ICD-10-CM

## 2014-07-22 ENCOUNTER — Ambulatory Visit (INDEPENDENT_AMBULATORY_CARE_PROVIDER_SITE_OTHER): Payer: Medicare Other | Admitting: Neurology

## 2014-07-22 ENCOUNTER — Encounter (INDEPENDENT_AMBULATORY_CARE_PROVIDER_SITE_OTHER): Payer: Self-pay

## 2014-07-22 DIAGNOSIS — M545 Low back pain, unspecified: Secondary | ICD-10-CM

## 2014-07-22 DIAGNOSIS — R209 Unspecified disturbances of skin sensation: Secondary | ICD-10-CM

## 2014-07-22 DIAGNOSIS — R202 Paresthesia of skin: Secondary | ICD-10-CM

## 2014-07-22 DIAGNOSIS — Z0289 Encounter for other administrative examinations: Secondary | ICD-10-CM

## 2014-07-22 MED ORDER — NORTRIPTYLINE HCL 25 MG PO CAPS: ORAL_CAPSULE | ORAL | Status: DC

## 2014-07-22 MED ORDER — LIDOCAINE 4 % EX CREA: 1.0000 "application " | TOPICAL_CREAM | CUTANEOUS | Status: DC | PRN

## 2014-07-22 NOTE — Progress Notes (Signed)
PATIENT: Susan Grimes DOB: October 09, 1946  HISTORICAL  GISSEL KEILMAN is a 68 years old right-handed female, accompanied by her daughter, Referred by her primary care physician Dr. Kathyrn Lass for evaluation of low back pain, bilateral feet paresthesia  She had a history of low back decompression surgery in 2003, which did help her low back pain, but she continued to have moderate to severe intermittent low back pain, shooting pain to her bilateral lower extremity, has been under pain management Dr. Nicholaus Bloom, over the years, she stayed on dose methadone 10 mg twice a day, there was some misunderstanding about the dosage of her methadone recently, she has stopped going to pain management, stopped taking methadone about 2 months ago, in July 2015.  Coincident with her methadone withdrawal, she began to notice worsening low back pain, radiating pain to bilateral lower extremity, worsening bilateral lower extremity paresthesia.  She reported that she has tried Neurontin, multiple other neuropathic pain medication in the past, without helping her  She denies bilateral upper extremity paresthesia  UPDATE Sept 24th 2015: She came in with her daughter, we have reviewed MRI of the lumbar spine, 1. At L4-5: disc bulging and facet hypertrophy with severe right and moderate left foraminal stenosis  2. At L5-S1: right laminectomy; disc bulging and facet hypertrophy with severe right and moderate left foraminal stenosis  3. At L3-4: disc bulging and facet hypertrophy with mild biforaminal stenosis  4. Chronic compression fracture of L1 with superior schmorl's node (40% loss of height).  5. Degenerative endplate disease with schmorl's node anteriorly at L4-5 with marrow edema.  Today's electrodiagnostic study showed mild chronic right L4 radiculopathy, no active denervation, no evidence of large fiber peripheral neuropathy.   She is taking nortriptyline, 20 mg every night, which did help her  right-sided low back pain, radiating pain, she complains of right foot sensitivity, gait difficulty  REVIEW OF SYSTEMS: Full 14 system review of systems performed and notable only for low back pain, right foot burning,   ALLERGIES: Allergies  Allergen Reactions  . Asa [Aspirin] Other (See Comments)    Bruses, and thins her blood  . Fentanyl Nausea And Vomiting and Other (See Comments)    Patch---headache    HOME MEDICATIONS: Current Outpatient Prescriptions on File Prior to Visit  Medication Sig Dispense Refill  . acetaminophen (TYLENOL) 500 MG tablet Take 1,000 mg by mouth every 6 (six) hours as needed for moderate pain.      Marland Kitchen alendronate (FOSAMAX) 70 MG tablet Take 70 mg by mouth every Sunday. Take with a full glass of water on an empty stomach.      . cetirizine (ZYRTEC) 10 MG tablet Take 10 mg by mouth daily as needed for allergies.      . citalopram (CELEXA) 40 MG tablet Take 40 mg by mouth daily.      Marland Kitchen docusate sodium (COLACE) 100 MG capsule Take 1 capsule (100 mg total) by mouth every 12 (twelve) hours.  20 capsule  0   No current facility-administered medications on file prior to visit.    PAST MEDICAL HISTORY: Past Medical History  Diagnosis Date  . Chronic pain   . Eczema   . Anxiety   . Depression   . Hiatal hernia   . Osteoporosis   . Lumbar back pain   . Renal disorder 06/2013    CKD stage 3    PAST SURGICAL HISTORY: Past Surgical History  Procedure Laterality Date  . Back  surgery    . Cholecystectomy    . Tonsillectomy    . Thyroidectomy, partial    . Tubal ligation      FAMILY HISTORY: No family history on file.  SOCIAL HISTORY:  History   Social History  . Marital Status: Divorced    Spouse Name: N/A    Number of Children: 1  . Years of Education: 12   Occupational History  . 1    Social History Main Topics  . Smoking status: Never Smoker   . Smokeless tobacco: Never Used  . Alcohol Use: Yes     Comment: occ  . Drug Use: No  .  Sexual Activity: Not on file   Other Topics Concern  . Not on file   Social History Narrative   Patient is single with one child.   Patient is right handed.   Patient has high school education.   Patient drinks 3 cups daily.     PHYSICAL EXAM   There were no vitals filed for this visit.  Not recorded    There is no weight on file to calculate BMI.   Generalized: In no acute distress  Neck: Supple, no carotid bruits   Cardiac: Regular rate rhythm  Pulmonary: Clear to auscultation bilaterally  Musculoskeletal: No deformity  Neurological examination  Mentation: Alert oriented to time, place, history taking, and causual conversation  Cranial nerve II-XII: Pupils were equal round reactive to light. Extraocular movements were full.  Visual field were full on confrontational test. Bilateral fundi were sharp.  Facial sensation and strength were normal. Hearing was intact to finger rubbing bilaterally. Uvula tongue midline.  Head turning and shoulder shrug and were normal and symmetric.Tongue protrusion into cheek strength was normal.  Motor: She has mild right toe extension, right toe flexion weakness.  Sensory: Intact to fine touch, pinprick, preserved vibratory sensation, and proprioception at toes.  Coordination: Normal finger to nose, heel-to-shin bilaterally there was no truncal ataxia  Gait: Rising up from seated position without assistance, normal stance, without trunk ataxia, moderate stride, good arm swing, smooth turning, able to perform tiptoe, and heel walking with mild to moderate difficulty.   Romberg signs: Negative  Deep tendon reflexes: Brachioradialis 2/2, biceps 2/2, triceps 2/2, patellar 2/2, Achilles trace, plantar responses were flexor bilaterally.   DIAGNOSTIC DATA (LABS, IMAGING, TESTING) - I reviewed patient records, labs, notes, testing and imaging myself where available.  Lab Results  Component Value Date   WBC 9.9 05/04/2014   HGB 13.2  05/04/2014   HCT 39.9 05/04/2014   MCV 91.1 05/04/2014   PLT 265 05/04/2014      Component Value Date/Time   NA 140 05/04/2014 1448   K 3.6* 05/04/2014 1448   CL 101 05/04/2014 1448   CO2 24 05/04/2014 1448   GLUCOSE 105* 05/04/2014 1448   BUN 14 05/04/2014 1448   CREATININE 0.87 05/04/2014 1448   CALCIUM 9.6 05/04/2014 1448   PROT 7.7 05/04/2014 1448   ALBUMIN 4.3 05/04/2014 1448   AST 16 05/04/2014 1448   ALT 7 05/04/2014 1448   ALKPHOS 53 05/04/2014 1448   BILITOT 0.7 05/04/2014 1448   GFRNONAA 66* 05/04/2014 1448   GFRAA 77* 05/04/2014 1448    ASSESSMENT AND PLAN  ANAISABEL PEDERSON is a 69 y.o. female with past medical history of lumbar decompression surgery, presenting with persistent low back pain, worsening pain after stop taking methadone, worsening bilateral lower extremity paresthesia, radiating pain to right lower extremity,MRI lumbar spine demonstrating:  L4-5: disc bulging and facet hypertrophy with severe right and moderate left foraminal stenosis. L5-S1: right laminectomy; disc bulging and facet hypertrophy with severe right and moderate left foraminal stenosis. L3-4: disc bulging and facet hypertrophy with mild biforaminal stenosis.CWere  nic compression fracture of L1 with superior schmorl's node (40% loss of height). Today's electrodiagnostic evidence only demonstrate mild chronic right L4 radiculopathy, no evidence of active process,  Her lower back pain has improved with nortriptyline 20 mg every night, will titrate up the dosage to 50 mg every night, also advise her back stretching exercise,   Return to clinic in 6 months, if she has worsening low back pain, may consider epidural injection, she prefer the surgical decompression as the last resort.   Marcial Pacas, M.D. Ph.D.  [facility] [address] [place], [place] [address] [phone]

## 2014-07-22 NOTE — Procedures (Signed)
   NCS (NERVE CONDUCTION STUDY) WITH EMG (ELECTROMYOGRAPHY) REPORT   STUDY DATE: September 24th 2015 PATIENT NAME: Susan Grimes DOB: 1946/10/29 MRN: [id number]    TECHNOLOGIST: Laretta Alstrom ELECTROMYOGRAPHER: Marcial Pacas M.D.  CLINICAL INFORMATION:  68 years old Caucasian female, with history of chronic low back pain, radiating pain to right leg.  FINDINGS: NERVE CONDUCTION STUDY:  Bilateral peroneal sensory responses were normal. Bilateral tibial, left peroneal motor responses were normal. Bilateral tibial H. reflexes were normal and symmetric. Right peroneal to EDB motor responses showed moderately decreased C. map amplitude.  NEEDLE ELECTROMYOGRAPHY:  Selected needle examination was performed at right lower extremity muscles, and right lumbosacral paraspinal muscles  Right tibialis anterior: Normally insertion activity, no spontaneous activity, simple morphology, mildly enlarged motor unit potential with mildly decreased recruitment patterns.  Needle examination of right tibialis posterior, peroneal longus, vastus lateralis, biceps femoris short head, gluteus medius was normal  There was no spontaneous activity at right lumbosacral paraspinal muscles, right L4, L5, S1, there was frequent muscle artifact  IMPRESSION:   This is a slight abnormal study. There is evidence of mild chronic right L4 radiculopathy, there was no evidence of active process. There was no evidence of large fiber peripheral neuropathy.   INTERPRETING PHYSICIAN:   Marcial Pacas M.D. Ph.D. [facility] [address], [place], [place] [address] [phone]

## 2014-09-08 ENCOUNTER — Encounter: Payer: Self-pay | Admitting: Neurology

## 2014-09-29 DIAGNOSIS — L299 Pruritus, unspecified: Secondary | ICD-10-CM | POA: Diagnosis not present

## 2014-09-29 DIAGNOSIS — G479 Sleep disorder, unspecified: Secondary | ICD-10-CM | POA: Diagnosis not present

## 2014-09-29 DIAGNOSIS — J309 Allergic rhinitis, unspecified: Secondary | ICD-10-CM | POA: Diagnosis not present

## 2014-09-29 DIAGNOSIS — R350 Frequency of micturition: Secondary | ICD-10-CM | POA: Diagnosis not present

## 2014-10-06 DIAGNOSIS — B079 Viral wart, unspecified: Secondary | ICD-10-CM | POA: Diagnosis not present

## 2014-10-06 DIAGNOSIS — D485 Neoplasm of uncertain behavior of skin: Secondary | ICD-10-CM | POA: Diagnosis not present

## 2014-10-20 DIAGNOSIS — R350 Frequency of micturition: Secondary | ICD-10-CM | POA: Diagnosis not present

## 2014-11-24 ENCOUNTER — Other Ambulatory Visit: Payer: Self-pay

## 2014-11-24 DIAGNOSIS — Z124 Encounter for screening for malignant neoplasm of cervix: Secondary | ICD-10-CM | POA: Diagnosis not present

## 2014-11-24 DIAGNOSIS — Z1231 Encounter for screening mammogram for malignant neoplasm of breast: Secondary | ICD-10-CM | POA: Diagnosis not present

## 2014-11-24 DIAGNOSIS — Z01419 Encounter for gynecological examination (general) (routine) without abnormal findings: Secondary | ICD-10-CM | POA: Diagnosis not present

## 2014-11-24 DIAGNOSIS — N763 Subacute and chronic vulvitis: Secondary | ICD-10-CM | POA: Diagnosis not present

## 2014-11-25 LAB — CYTOLOGY - PAP

## 2014-12-29 DIAGNOSIS — M4125 Other idiopathic scoliosis, thoracolumbar region: Secondary | ICD-10-CM | POA: Diagnosis not present

## 2014-12-29 DIAGNOSIS — Z79891 Long term (current) use of opiate analgesic: Secondary | ICD-10-CM | POA: Diagnosis not present

## 2014-12-29 DIAGNOSIS — G894 Chronic pain syndrome: Secondary | ICD-10-CM | POA: Diagnosis not present

## 2014-12-29 DIAGNOSIS — M4726 Other spondylosis with radiculopathy, lumbar region: Secondary | ICD-10-CM | POA: Diagnosis not present

## 2014-12-30 DIAGNOSIS — R195 Other fecal abnormalities: Secondary | ICD-10-CM | POA: Diagnosis not present

## 2015-01-06 DIAGNOSIS — R195 Other fecal abnormalities: Secondary | ICD-10-CM | POA: Diagnosis not present

## 2015-01-27 ENCOUNTER — Ambulatory Visit: Payer: Medicare Other | Admitting: Neurology

## 2015-01-27 DIAGNOSIS — M4726 Other spondylosis with radiculopathy, lumbar region: Secondary | ICD-10-CM | POA: Diagnosis not present

## 2015-01-27 DIAGNOSIS — G894 Chronic pain syndrome: Secondary | ICD-10-CM | POA: Diagnosis not present

## 2015-01-27 DIAGNOSIS — M4125 Other idiopathic scoliosis, thoracolumbar region: Secondary | ICD-10-CM | POA: Diagnosis not present

## 2015-01-31 ENCOUNTER — Telehealth: Payer: Self-pay | Admitting: *Deleted

## 2015-01-31 ENCOUNTER — Ambulatory Visit: Payer: Medicare Other | Admitting: Neurology

## 2015-01-31 NOTE — Telephone Encounter (Signed)
No showed appointment today. 

## 2015-02-01 ENCOUNTER — Encounter: Payer: Self-pay | Admitting: Neurology

## 2015-02-01 DIAGNOSIS — B354 Tinea corporis: Secondary | ICD-10-CM | POA: Diagnosis not present

## 2015-02-17 DIAGNOSIS — R195 Other fecal abnormalities: Secondary | ICD-10-CM | POA: Diagnosis not present

## 2015-02-18 DIAGNOSIS — H2513 Age-related nuclear cataract, bilateral: Secondary | ICD-10-CM | POA: Diagnosis not present

## 2015-02-18 DIAGNOSIS — H04123 Dry eye syndrome of bilateral lacrimal glands: Secondary | ICD-10-CM | POA: Diagnosis not present

## 2015-03-06 ENCOUNTER — Emergency Department (HOSPITAL_COMMUNITY)
Admission: EM | Admit: 2015-03-06 | Discharge: 2015-03-06 | Disposition: A | Discharge status: Home / Self Care | Payer: Medicare Other | Attending: Emergency Medicine | Admitting: Emergency Medicine

## 2015-03-06 ENCOUNTER — Encounter (HOSPITAL_COMMUNITY): Payer: Self-pay

## 2015-03-06 ENCOUNTER — Emergency Department (HOSPITAL_COMMUNITY): Payer: Medicare Other

## 2015-03-06 DIAGNOSIS — F419 Anxiety disorder, unspecified: Secondary | ICD-10-CM | POA: Diagnosis not present

## 2015-03-06 DIAGNOSIS — G8929 Other chronic pain: Secondary | ICD-10-CM | POA: Diagnosis not present

## 2015-03-06 DIAGNOSIS — S52502A Unspecified fracture of the lower end of left radius, initial encounter for closed fracture: Secondary | ICD-10-CM | POA: Insufficient documentation

## 2015-03-06 DIAGNOSIS — S52612A Displaced fracture of left ulna styloid process, initial encounter for closed fracture: Secondary | ICD-10-CM | POA: Diagnosis not present

## 2015-03-06 DIAGNOSIS — Z872 Personal history of diseases of the skin and subcutaneous tissue: Secondary | ICD-10-CM | POA: Insufficient documentation

## 2015-03-06 DIAGNOSIS — Y9301 Activity, walking, marching and hiking: Secondary | ICD-10-CM | POA: Insufficient documentation

## 2015-03-06 DIAGNOSIS — Y998 Other external cause status: Secondary | ICD-10-CM | POA: Diagnosis not present

## 2015-03-06 DIAGNOSIS — S90811A Abrasion, right foot, initial encounter: Secondary | ICD-10-CM | POA: Diagnosis not present

## 2015-03-06 DIAGNOSIS — M810 Age-related osteoporosis without current pathological fracture: Secondary | ICD-10-CM | POA: Insufficient documentation

## 2015-03-06 DIAGNOSIS — W108XXA Fall (on) (from) other stairs and steps, initial encounter: Secondary | ICD-10-CM | POA: Insufficient documentation

## 2015-03-06 DIAGNOSIS — Z8719 Personal history of other diseases of the digestive system: Secondary | ICD-10-CM | POA: Diagnosis not present

## 2015-03-06 DIAGNOSIS — F329 Major depressive disorder, single episode, unspecified: Secondary | ICD-10-CM | POA: Insufficient documentation

## 2015-03-06 DIAGNOSIS — S6992XA Unspecified injury of left wrist, hand and finger(s), initial encounter: Secondary | ICD-10-CM | POA: Diagnosis present

## 2015-03-06 DIAGNOSIS — N183 Chronic kidney disease, stage 3 (moderate): Secondary | ICD-10-CM | POA: Diagnosis not present

## 2015-03-06 DIAGNOSIS — Y92511 Restaurant or cafe as the place of occurrence of the external cause: Secondary | ICD-10-CM | POA: Diagnosis not present

## 2015-03-06 DIAGNOSIS — Z79899 Other long term (current) drug therapy: Secondary | ICD-10-CM | POA: Diagnosis not present

## 2015-03-06 DIAGNOSIS — S59292A Other physeal fracture of lower end of radius, left arm, initial encounter for closed fracture: Secondary | ICD-10-CM | POA: Diagnosis not present

## 2015-03-06 DIAGNOSIS — S62102A Fracture of unspecified carpal bone, left wrist, initial encounter for closed fracture: Secondary | ICD-10-CM

## 2015-03-06 DIAGNOSIS — S52615A Nondisplaced fracture of left ulna styloid process, initial encounter for closed fracture: Secondary | ICD-10-CM | POA: Diagnosis not present

## 2015-03-06 DIAGNOSIS — S52512A Displaced fracture of left radial styloid process, initial encounter for closed fracture: Secondary | ICD-10-CM | POA: Diagnosis not present

## 2015-03-06 IMAGING — CR DG WRIST COMPLETE 3+V*L*
4 series · 4 of 4 positions shown · non-contrast
Comparison: None.

CLINICAL DATA: Left wrist pain after slip and fall injury.

EXAM:
LEFT WRIST - COMPLETE 3+ VIEW

[x wrist pa left]
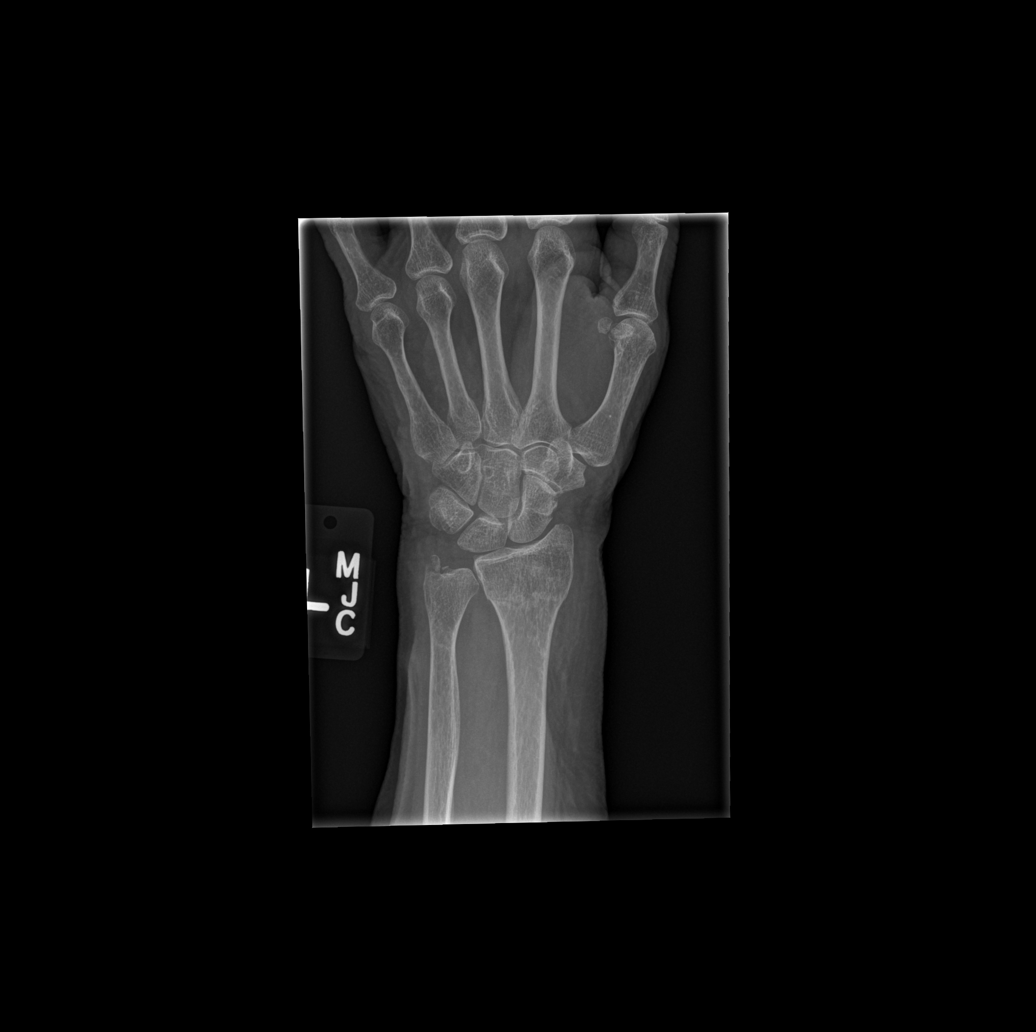

[x wrist obl left]
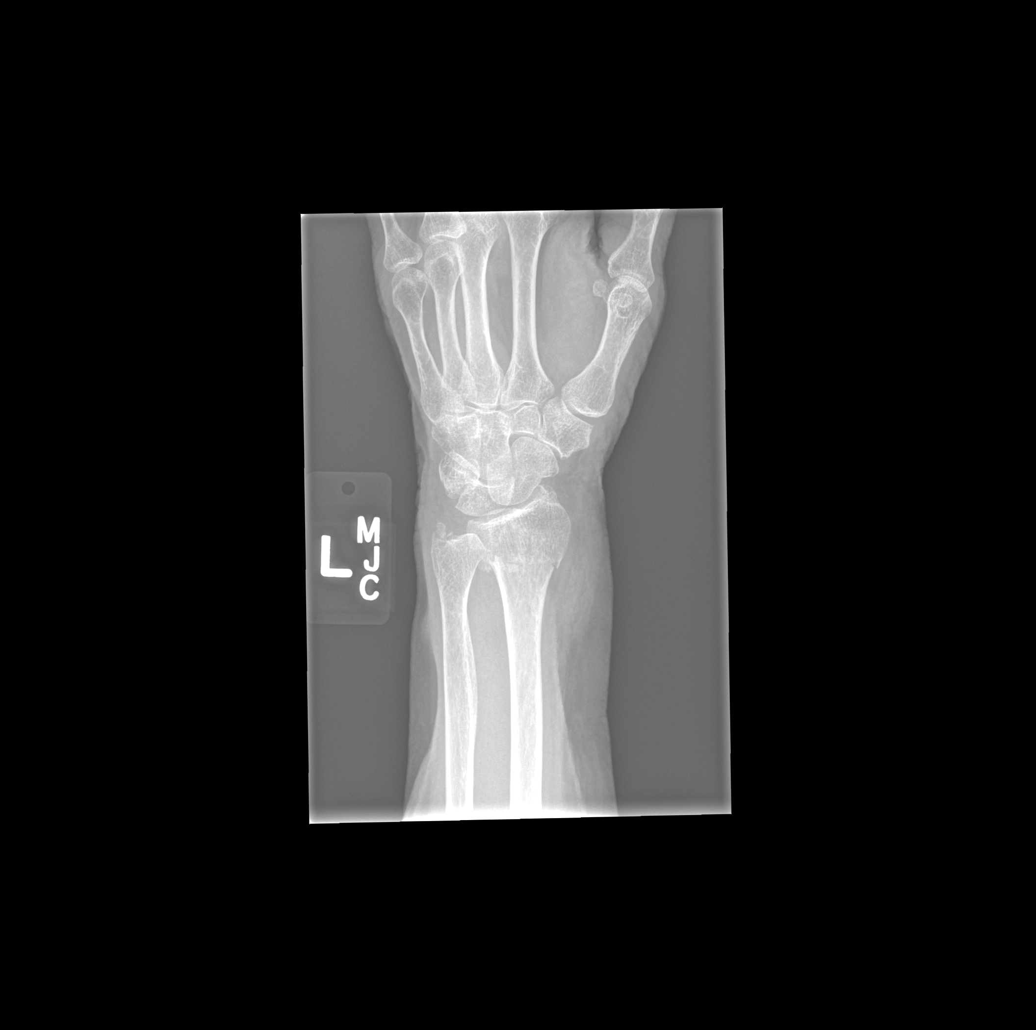

[x wrist lat left]
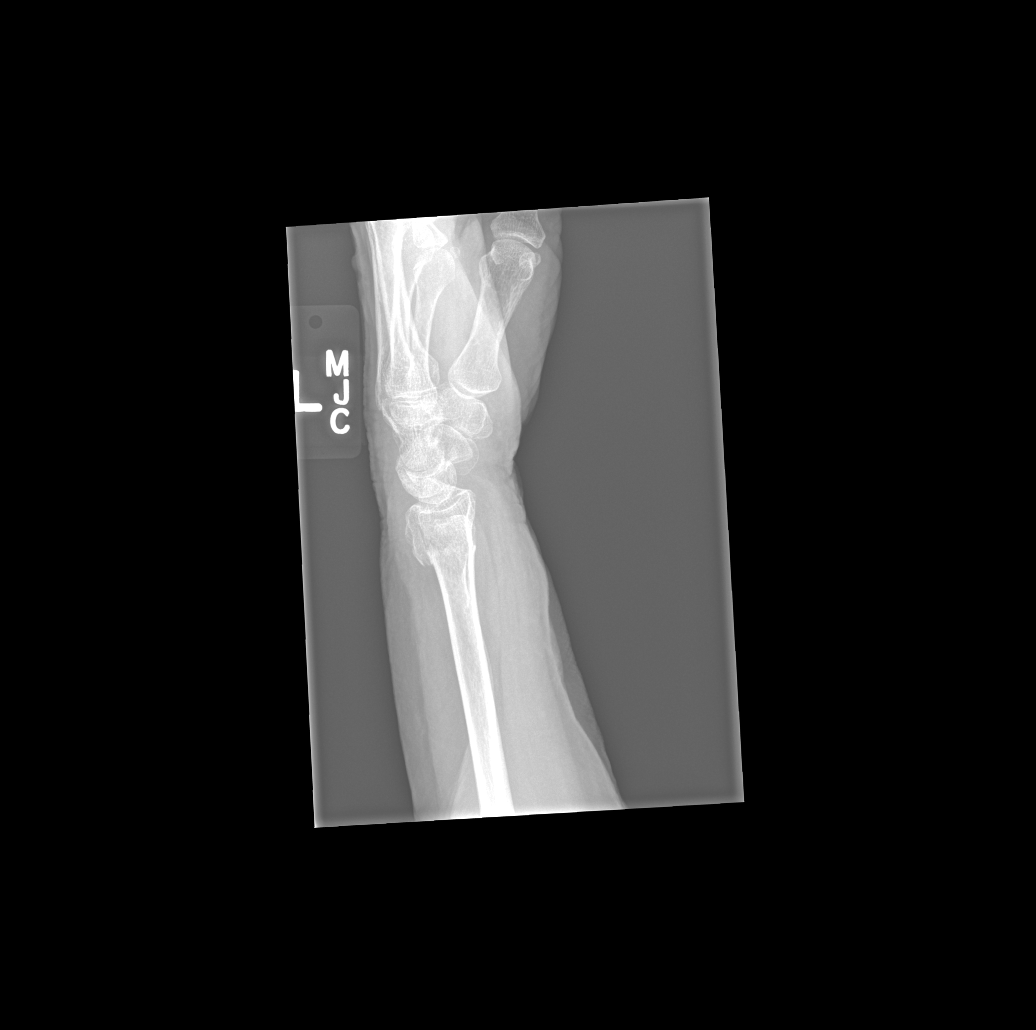

[x wrist navicular view left]
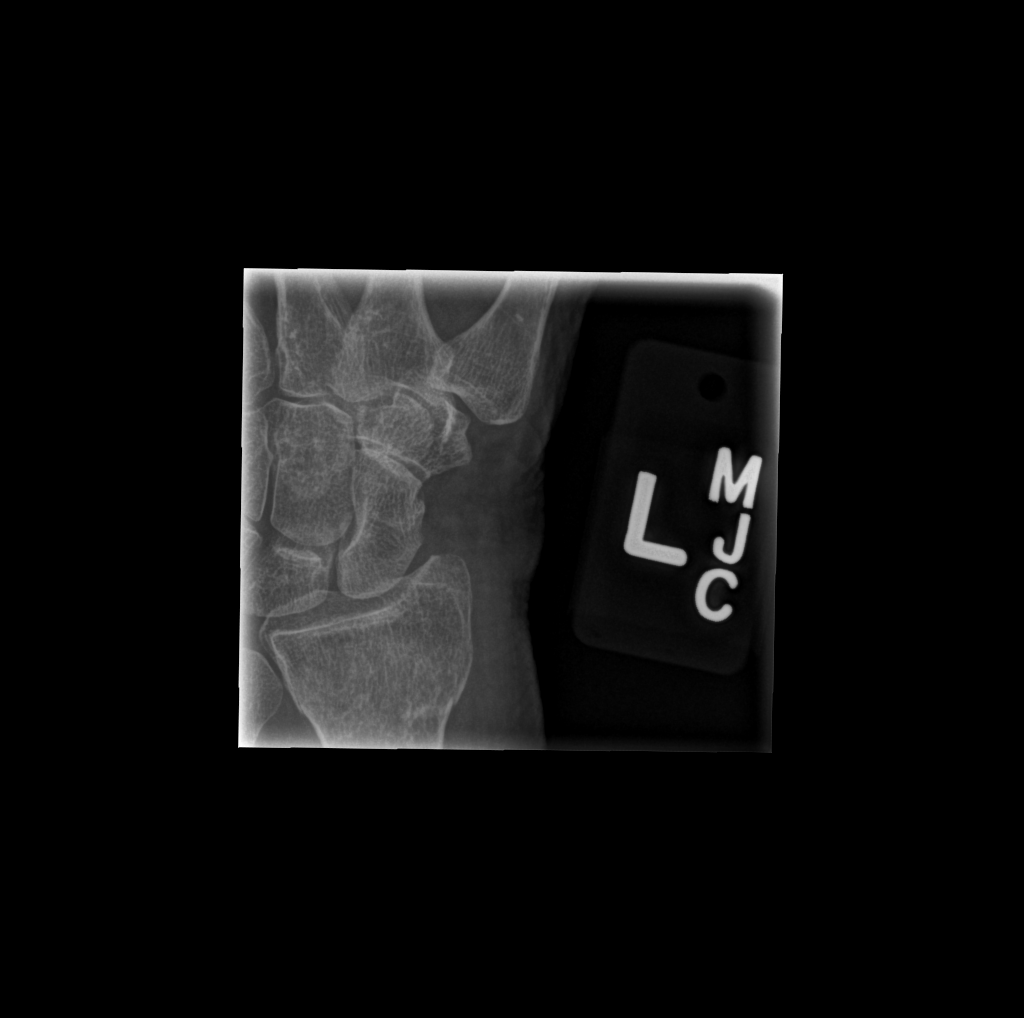

[4 of 4 positions shown; findings below may reference images not displayed]

FINDINGS: Comminuted fractures of the distal left radial metaphysis with
mostly transverse orientation. Fracture lines extend to the radial
ulnar joint without definite extension to the radiocarpal joint.
There is an associated ulnar styloid process fracture without
significant displacement. Impaction of fracture fragments. Mild soft
tissue swelling. Degenerative changes in the carpus.
IMPRESSION: Acute posttraumatic fractures of the distal left radial metaphysis
extending to the radial ulnar joint. Associated left ulnar styloid
process fracture.

## 2015-03-06 MED ORDER — HYDROCODONE-ACETAMINOPHEN 5-325 MG PO TABS
1.0000 | ORAL_TABLET | Freq: Once | ORAL | Status: AC
  Administered 2015-03-06: 1 via ORAL
  Filled 2015-03-06: qty 1

## 2015-03-06 MED ORDER — HYDROCODONE-ACETAMINOPHEN 5-325 MG PO TABS: 1.0000 | ORAL_TABLET | ORAL | Status: DC | PRN

## 2015-03-06 NOTE — ED Provider Notes (Addendum)
slipped on wet steps injuring her left wrist 8 PM tonight. No other injury. Felt well prior to the event. On exam alert Glasgow Coma Score 15 mildly uncomfortable.. Left upper extremity no deformity. Tender over dorsum of left wrist. Radial pulse 2+. No anatomic snuffbox tenderness  Orlie Dakin, MD 03/06/15 2225  Orlie Dakin, MD 03/09/15 6803

## 2015-03-06 NOTE — Discharge Instructions (Signed)
Please follow the directions provided. Be sure to follow-up with the hand doctor tomorrow for further management and evaluation of the fracture in your wrist. Keep your splint clean and dry. You may take the Vicodin as needed for pain. Don't hesitate to return for any new, worsening, or concerning symptoms.   SEEK IMMEDIATE MEDICAL CARE IF:  Your hand or fingernails on the injured arm turn blue or gray, or feel cold or numb.  You have decreased feeling in the fingers of your injured arm.

## 2015-03-06 NOTE — ED Provider Notes (Signed)
CSN: [id number]     Arrival date & time 03/06/15  2159 History  This chart was scribed for non-physician practitioner, Britt Bottom, NP-C working with Orlie Dakin, MD, by Chester Holstein, ED Scribe. This patient was seen in room WTR5/WTR5 and the patient's care was started at 10:11 PM.    Chief Complaint  Patient presents with  . Wrist Injury    Patient is a 69 y.o. female presenting with wrist injury. The history is provided by the patient. No language interpreter was used.  Wrist Injury  HPI Comments: Susan Grimes is a 69 y.o. female with PMHx of osteoporosis who presents to the Emergency Department complaining of fall around 8:45 PM. She was walking out of a restaurant and slipped on steps, catching her fall with her left hand on concrete below steps. Pt notes associated mild abrasions to feet and 10/10 left wrist pain. PT states movement aggravates the pain. Pt denies dizziness.   Past Medical History  Diagnosis Date  . Chronic pain   . Eczema   . Anxiety   . Depression   . Hiatal hernia   . Osteoporosis   . Lumbar back pain   . Renal disorder 06/2013    CKD stage 3   Past Surgical History  Procedure Laterality Date  . Back surgery    . Cholecystectomy    . Tonsillectomy    . Thyroidectomy, partial    . Tubal ligation     No family history on file. History  Substance Use Topics  . Smoking status: Never Smoker   . Smokeless tobacco: Never Used  . Alcohol Use: Yes     Comment: occ   OB History    No data available     Review of Systems  Musculoskeletal: Positive for arthralgias.  Neurological: Negative for dizziness.      Allergies  Asa and Fentanyl  Home Medications   Prior to Admission medications   Medication Sig Start Date End Date Taking? Authorizing Provider  acetaminophen (TYLENOL) 500 MG tablet Take 1,000 mg by mouth every 6 (six) hours as needed for moderate pain.    Historical Provider, MD  alendronate (FOSAMAX) 70 MG tablet Take 70 mg  by mouth every Sunday. Take with a full glass of water on an empty stomach.    Historical Provider, MD  cetirizine (ZYRTEC) 10 MG tablet Take 10 mg by mouth daily as needed for allergies.    Historical Provider, MD  citalopram (CELEXA) 40 MG tablet Take 40 mg by mouth daily.    Historical Provider, MD  docusate sodium (COLACE) 100 MG capsule Take 1 capsule (100 mg total) by mouth every 12 (twelve) hours. 05/04/14   Dorie Rank, MD  lidocaine (LMX) 4 % cream Apply 1 application topically as needed. 07/22/14   Marcial Pacas, MD  nortriptyline (PAMELOR) 25 MG capsule One po qhs xone week, then 2 tabs po qhs 07/22/14   Marcial Pacas, MD   BP 158/86 mmHg  Pulse 104  Temp(Src) 97.6 F (36.4 C) (Oral)  Resp 20  Ht 5\' 5"  (1.651 m)  Wt 148 lb (67.132 kg)  BMI 24.63 kg/m2  SpO2 95% Physical Exam  Constitutional: She is oriented to person, place, and time. She appears well-developed and well-nourished.  HENT:  Head: Normocephalic.  Eyes: Conjunctivae are normal.  Neck: Normal range of motion. Neck supple.  Pulmonary/Chest: Effort normal.  Musculoskeletal: Normal range of motion.       Left wrist: She exhibits tenderness (to palpation  dorsal aspect).       Right foot: There is no swelling.  5/5 strength briefly with grip strength however reproduces pain, no scaphoid TTP NVI distally Right foot: 5/5 strength with plantar and dorsiflexion: no swelling 2 small abrasions on anterior aspect of right foot near the medial malleolus  Neurological: She is alert and oriented to person, place, and time.  Skin: Skin is warm and dry.  Psychiatric: She has a normal mood and affect. Her behavior is normal.  Nursing note and vitals reviewed.   ED Course  Procedures (including critical care time) DIAGNOSTIC STUDIES: Oxygen Saturation is 95% on room air, normal by my interpretation.    COORDINATION OF CARE: 10:15 PM Discussed treatment plan with patient at beside, the patient agrees with the plan and has no further  questions at this time.   Labs Review Labs Reviewed - No data to display  Imaging Review Dg Wrist Complete Left  03/06/2015   CLINICAL DATA:  Left wrist pain after slip and fall injury.  EXAM: LEFT WRIST - COMPLETE 3+ VIEW  COMPARISON:  None.  FINDINGS: Comminuted fractures of the distal left radial metaphysis with mostly transverse orientation. Fracture lines extend to the radial ulnar joint without definite extension to the radiocarpal joint. There is an associated ulnar styloid process fracture without significant displacement. Impaction of fracture fragments. Mild soft tissue swelling. Degenerative changes in the carpus.  IMPRESSION: Acute posttraumatic fractures of the distal left radial metaphysis extending to the radial ulnar joint. Associated left ulnar styloid process fracture.   Electronically Signed   By: Lucienne Capers M.D.   On: 03/06/2015 23:08     EKG Interpretation None      MDM   Final diagnoses:  Wrist fracture, closed, left, initial encounter   69 yo with distal left radial metaphysis fracture and left ulnar styloid process fracture on x-ray.  She sustained from a mechanical fall after slipping on wet steps. No dizziness or light-headedness prior to fall. Discussed case with Dr. Winfred Leeds. Sugar tong splint placed and pt denies discomfort, n/v intact after splint placement. Pt is well-appearing, in no acute distress and vital signs reviewed and not concerning. She appears safe to be discharged.  Discharge include follow-up with hand surgery. Return precautions provided. Pt aware of plan and in agreement.   I personally performed the services described in this documentation, which was scribed in my presence. The recorded information has been reviewed and is accurate.  Filed Vitals:   03/06/15 2202 03/06/15 2349  BP: 158/86 141/81  Pulse: 104 80  Temp: 97.6 F (36.4 C)   TempSrc: Oral   Resp: 20 18  Height: 5\' 5"  (1.651 m)   Weight: 148 lb (67.132 kg)   SpO2: 95%  97%   Meds given in ED:  Medications  HYDROcodone-acetaminophen (NORCO/VICODIN) 5-325 MG per tablet 1 tablet (1 tablet Oral Given 03/06/15 2229)    Discharge Medication List as of 03/06/2015 11:43 PM    START taking these medications   Details  HYDROcodone-acetaminophen (NORCO/VICODIN) 5-325 MG per tablet Take 1 tablet by mouth every 4 (four) hours as needed., Starting 03/06/2015, Until Discontinued, Print             Britt Bottom, NP 03/08/15 0134  Orlie Dakin, MD 03/09/15 0973

## 2015-03-06 NOTE — ED Notes (Signed)
Pt presents with c/o fall that occurred earlier this evening. Pt slid down some stairs outside of a restaurant, caught herself on her left wrist. Pt also has some minor abrasions to her right foot.

## 2015-03-07 DIAGNOSIS — S52532A Colles' fracture of left radius, initial encounter for closed fracture: Secondary | ICD-10-CM | POA: Diagnosis not present

## 2015-04-18 DIAGNOSIS — S52532D Colles' fracture of left radius, subsequent encounter for closed fracture with routine healing: Secondary | ICD-10-CM | POA: Diagnosis not present

## 2015-05-26 DIAGNOSIS — M4726 Other spondylosis with radiculopathy, lumbar region: Secondary | ICD-10-CM | POA: Diagnosis not present

## 2015-05-26 DIAGNOSIS — M4125 Other idiopathic scoliosis, thoracolumbar region: Secondary | ICD-10-CM | POA: Diagnosis not present

## 2015-05-26 DIAGNOSIS — G894 Chronic pain syndrome: Secondary | ICD-10-CM | POA: Diagnosis not present

## 2015-05-26 DIAGNOSIS — Z79891 Long term (current) use of opiate analgesic: Secondary | ICD-10-CM | POA: Diagnosis not present

## 2015-09-21 DIAGNOSIS — Z79891 Long term (current) use of opiate analgesic: Secondary | ICD-10-CM | POA: Diagnosis not present

## 2015-09-21 DIAGNOSIS — M4125 Other idiopathic scoliosis, thoracolumbar region: Secondary | ICD-10-CM | POA: Diagnosis not present

## 2015-09-21 DIAGNOSIS — M4726 Other spondylosis with radiculopathy, lumbar region: Secondary | ICD-10-CM | POA: Diagnosis not present

## 2015-09-21 DIAGNOSIS — G894 Chronic pain syndrome: Secondary | ICD-10-CM | POA: Diagnosis not present

## 2015-12-21 DIAGNOSIS — G894 Chronic pain syndrome: Secondary | ICD-10-CM | POA: Diagnosis not present

## 2015-12-21 DIAGNOSIS — M4125 Other idiopathic scoliosis, thoracolumbar region: Secondary | ICD-10-CM | POA: Diagnosis not present

## 2015-12-21 DIAGNOSIS — Z79891 Long term (current) use of opiate analgesic: Secondary | ICD-10-CM | POA: Diagnosis not present

## 2015-12-21 DIAGNOSIS — M4726 Other spondylosis with radiculopathy, lumbar region: Secondary | ICD-10-CM | POA: Diagnosis not present

## 2016-03-20 DIAGNOSIS — G894 Chronic pain syndrome: Secondary | ICD-10-CM | POA: Diagnosis not present

## 2016-03-20 DIAGNOSIS — M4726 Other spondylosis with radiculopathy, lumbar region: Secondary | ICD-10-CM | POA: Diagnosis not present

## 2016-03-20 DIAGNOSIS — M4125 Other idiopathic scoliosis, thoracolumbar region: Secondary | ICD-10-CM | POA: Diagnosis not present

## 2016-03-20 DIAGNOSIS — Z79891 Long term (current) use of opiate analgesic: Secondary | ICD-10-CM | POA: Diagnosis not present

## 2016-05-21 DIAGNOSIS — L219 Seborrheic dermatitis, unspecified: Secondary | ICD-10-CM | POA: Diagnosis not present

## 2016-05-21 DIAGNOSIS — L853 Xerosis cutis: Secondary | ICD-10-CM | POA: Diagnosis not present

## 2016-06-19 DIAGNOSIS — G894 Chronic pain syndrome: Secondary | ICD-10-CM | POA: Diagnosis not present

## 2016-06-19 DIAGNOSIS — M4726 Other spondylosis with radiculopathy, lumbar region: Secondary | ICD-10-CM | POA: Diagnosis not present

## 2016-06-19 DIAGNOSIS — M4125 Other idiopathic scoliosis, thoracolumbar region: Secondary | ICD-10-CM | POA: Diagnosis not present

## 2016-06-19 DIAGNOSIS — Z79891 Long term (current) use of opiate analgesic: Secondary | ICD-10-CM | POA: Diagnosis not present

## 2016-09-18 DIAGNOSIS — M4125 Other idiopathic scoliosis, thoracolumbar region: Secondary | ICD-10-CM | POA: Diagnosis not present

## 2016-09-18 DIAGNOSIS — G894 Chronic pain syndrome: Secondary | ICD-10-CM | POA: Diagnosis not present

## 2016-09-18 DIAGNOSIS — Z79891 Long term (current) use of opiate analgesic: Secondary | ICD-10-CM | POA: Diagnosis not present

## 2016-09-18 DIAGNOSIS — M4726 Other spondylosis with radiculopathy, lumbar region: Secondary | ICD-10-CM | POA: Diagnosis not present

## 2016-12-18 DIAGNOSIS — G894 Chronic pain syndrome: Secondary | ICD-10-CM | POA: Diagnosis not present

## 2016-12-18 DIAGNOSIS — M4125 Other idiopathic scoliosis, thoracolumbar region: Secondary | ICD-10-CM | POA: Diagnosis not present

## 2016-12-18 DIAGNOSIS — Z79891 Long term (current) use of opiate analgesic: Secondary | ICD-10-CM | POA: Diagnosis not present

## 2016-12-18 DIAGNOSIS — M4726 Other spondylosis with radiculopathy, lumbar region: Secondary | ICD-10-CM | POA: Diagnosis not present

## 2017-04-03 DIAGNOSIS — G894 Chronic pain syndrome: Secondary | ICD-10-CM | POA: Diagnosis not present

## 2017-04-03 DIAGNOSIS — M4125 Other idiopathic scoliosis, thoracolumbar region: Secondary | ICD-10-CM | POA: Diagnosis not present

## 2017-04-03 DIAGNOSIS — Z79891 Long term (current) use of opiate analgesic: Secondary | ICD-10-CM | POA: Diagnosis not present

## 2017-04-03 DIAGNOSIS — M4726 Other spondylosis with radiculopathy, lumbar region: Secondary | ICD-10-CM | POA: Diagnosis not present

## 2017-05-20 DIAGNOSIS — L200 Besnier's prurigo: Secondary | ICD-10-CM | POA: Diagnosis not present

## 2017-05-20 DIAGNOSIS — J309 Allergic rhinitis, unspecified: Secondary | ICD-10-CM | POA: Diagnosis not present

## 2017-05-22 DIAGNOSIS — L03211 Cellulitis of face: Secondary | ICD-10-CM | POA: Diagnosis not present

## 2017-07-02 DIAGNOSIS — M4125 Other idiopathic scoliosis, thoracolumbar region: Secondary | ICD-10-CM | POA: Diagnosis not present

## 2017-07-02 DIAGNOSIS — G894 Chronic pain syndrome: Secondary | ICD-10-CM | POA: Diagnosis not present

## 2017-07-02 DIAGNOSIS — M4726 Other spondylosis with radiculopathy, lumbar region: Secondary | ICD-10-CM | POA: Diagnosis not present

## 2017-07-02 DIAGNOSIS — Z79891 Long term (current) use of opiate analgesic: Secondary | ICD-10-CM | POA: Diagnosis not present

## 2017-07-26 DIAGNOSIS — R5383 Other fatigue: Secondary | ICD-10-CM | POA: Diagnosis not present

## 2017-07-26 DIAGNOSIS — M810 Age-related osteoporosis without current pathological fracture: Secondary | ICD-10-CM | POA: Diagnosis not present

## 2017-07-26 DIAGNOSIS — E7800 Pure hypercholesterolemia, unspecified: Secondary | ICD-10-CM | POA: Diagnosis not present

## 2017-07-26 DIAGNOSIS — Z0000 Encounter for general adult medical examination without abnormal findings: Secondary | ICD-10-CM | POA: Diagnosis not present

## 2017-07-26 DIAGNOSIS — R351 Nocturia: Secondary | ICD-10-CM | POA: Diagnosis not present

## 2017-07-26 DIAGNOSIS — Z136 Encounter for screening for cardiovascular disorders: Secondary | ICD-10-CM | POA: Diagnosis not present

## 2017-07-26 DIAGNOSIS — E663 Overweight: Secondary | ICD-10-CM | POA: Diagnosis not present

## 2017-07-26 DIAGNOSIS — F339 Major depressive disorder, recurrent, unspecified: Secondary | ICD-10-CM | POA: Diagnosis not present

## 2017-07-26 DIAGNOSIS — Z1159 Encounter for screening for other viral diseases: Secondary | ICD-10-CM | POA: Diagnosis not present

## 2017-07-26 DIAGNOSIS — Z9889 Other specified postprocedural states: Secondary | ICD-10-CM | POA: Diagnosis not present

## 2017-07-26 DIAGNOSIS — K219 Gastro-esophageal reflux disease without esophagitis: Secondary | ICD-10-CM | POA: Diagnosis not present

## 2017-07-26 DIAGNOSIS — E559 Vitamin D deficiency, unspecified: Secondary | ICD-10-CM | POA: Diagnosis not present

## 2017-08-27 DIAGNOSIS — F339 Major depressive disorder, recurrent, unspecified: Secondary | ICD-10-CM | POA: Diagnosis not present

## 2017-08-27 DIAGNOSIS — F5101 Primary insomnia: Secondary | ICD-10-CM | POA: Diagnosis not present

## 2017-09-24 DIAGNOSIS — M810 Age-related osteoporosis without current pathological fracture: Secondary | ICD-10-CM | POA: Diagnosis not present

## 2017-09-30 DIAGNOSIS — M4726 Other spondylosis with radiculopathy, lumbar region: Secondary | ICD-10-CM | POA: Diagnosis not present

## 2017-09-30 DIAGNOSIS — M4125 Other idiopathic scoliosis, thoracolumbar region: Secondary | ICD-10-CM | POA: Diagnosis not present

## 2017-09-30 DIAGNOSIS — G894 Chronic pain syndrome: Secondary | ICD-10-CM | POA: Diagnosis not present

## 2017-09-30 DIAGNOSIS — Z79891 Long term (current) use of opiate analgesic: Secondary | ICD-10-CM | POA: Diagnosis not present

## 2017-10-04 DIAGNOSIS — F5101 Primary insomnia: Secondary | ICD-10-CM | POA: Diagnosis not present

## 2017-10-04 DIAGNOSIS — M810 Age-related osteoporosis without current pathological fracture: Secondary | ICD-10-CM | POA: Diagnosis not present

## 2017-10-04 DIAGNOSIS — F339 Major depressive disorder, recurrent, unspecified: Secondary | ICD-10-CM | POA: Diagnosis not present

## 2017-12-17 DIAGNOSIS — J3489 Other specified disorders of nose and nasal sinuses: Secondary | ICD-10-CM | POA: Diagnosis not present

## 2018-05-15 DIAGNOSIS — J3089 Other allergic rhinitis: Secondary | ICD-10-CM | POA: Diagnosis not present

## 2018-05-15 DIAGNOSIS — L739 Follicular disorder, unspecified: Secondary | ICD-10-CM | POA: Diagnosis not present

## 2018-05-15 DIAGNOSIS — J324 Chronic pansinusitis: Secondary | ICD-10-CM | POA: Diagnosis not present

## 2018-06-02 DIAGNOSIS — J3089 Other allergic rhinitis: Secondary | ICD-10-CM | POA: Diagnosis not present

## 2018-07-31 DIAGNOSIS — L2084 Intrinsic (allergic) eczema: Secondary | ICD-10-CM | POA: Diagnosis not present

## 2019-10-11 ENCOUNTER — Emergency Department (HOSPITAL_COMMUNITY)
Admission: EM | Admit: 2019-10-11 | Discharge: 2019-10-11 | Disposition: A | Discharge status: Home / Self Care | Payer: Medicare Other | Attending: Emergency Medicine | Admitting: Emergency Medicine

## 2019-10-11 ENCOUNTER — Other Ambulatory Visit: Payer: Self-pay

## 2019-10-11 ENCOUNTER — Encounter (HOSPITAL_COMMUNITY): Payer: Self-pay

## 2019-10-11 DIAGNOSIS — R1084 Generalized abdominal pain: Secondary | ICD-10-CM | POA: Insufficient documentation

## 2019-10-11 DIAGNOSIS — N183 Chronic kidney disease, stage 3 unspecified: Secondary | ICD-10-CM | POA: Diagnosis not present

## 2019-10-11 DIAGNOSIS — Z79899 Other long term (current) drug therapy: Secondary | ICD-10-CM | POA: Diagnosis not present

## 2019-10-11 DIAGNOSIS — R519 Headache, unspecified: Secondary | ICD-10-CM | POA: Insufficient documentation

## 2019-10-11 DIAGNOSIS — Z20828 Contact with and (suspected) exposure to other viral communicable diseases: Secondary | ICD-10-CM | POA: Diagnosis not present

## 2019-10-11 LAB — CBC
HCT: 45.5 % (ref 36.0–46.0)
Hemoglobin: 14.4 g/dL (ref 12.0–15.0)
MCH: 30.4 pg (ref 26.0–34.0)
MCHC: 31.6 g/dL (ref 30.0–36.0)
MCV: 96 fL (ref 80.0–100.0)
Platelets: 292 10*3/uL (ref 150–400)
RBC: 4.74 MIL/uL (ref 3.87–5.11)
RDW: 13.5 % (ref 11.5–15.5)
WBC: 8.9 10*3/uL (ref 4.0–10.5)
nRBC: 0 % (ref 0.0–0.2)

## 2019-10-11 LAB — COMPREHENSIVE METABOLIC PANEL
ALT: 18 U/L (ref 0–44)
AST: 23 U/L (ref 15–41)
Albumin: 4.3 g/dL (ref 3.5–5.0)
Alkaline Phosphatase: 70 U/L (ref 38–126)
Anion gap: <3 — ABNORMAL LOW (ref 5–15)
BUN: 13 mg/dL (ref 8–23)
CO2: 29 mmol/L (ref 22–32)
Calcium: 9.6 mg/dL (ref 8.9–10.3)
Chloride: 103 mmol/L (ref 98–111)
Creatinine, Ser: 0.83 mg/dL (ref 0.44–1.00)
GFR calc Af Amer: >60 mL/min (ref >60)
GFR calc non Af Amer: >60 mL/min (ref >60)
Glucose, Bld: 121 mg/dL — ABNORMAL HIGH (ref 70–99)
Potassium: 4.8 mmol/L (ref 3.5–5.1)
Sodium: 134 mmol/L — ABNORMAL LOW (ref 135–145)
Total Bilirubin: 0.7 mg/dL (ref 0.3–1.2)
Total Protein: 8.2 g/dL — ABNORMAL HIGH (ref 6.5–8.1)

## 2019-10-11 LAB — URINALYSIS, ROUTINE W REFLEX MICROSCOPIC
Bacteria, UA: NONE SEEN
Bilirubin Urine: NEGATIVE
Glucose, UA: NEGATIVE mg/dL
Hgb urine dipstick: NEGATIVE
Ketones, ur: 20 mg/dL — AB
Nitrite: NEGATIVE
Protein, ur: NEGATIVE mg/dL
Specific Gravity, Urine: 1.021 (ref 1.005–1.030)
pH: 6 (ref 5.0–8.0)

## 2019-10-11 LAB — POC SARS CORONAVIRUS 2 AG -  ED: SARS Coronavirus 2 Ag: NEGATIVE

## 2019-10-11 LAB — LIPASE, BLOOD: Lipase: 38 U/L (ref 11–51)

## 2019-10-11 MED ORDER — SODIUM CHLORIDE 0.9 % IV BOLUS
1000.0000 mL | Freq: Once | INTRAVENOUS | Status: AC
  Administered 2019-10-11: 1000 mL via INTRAVENOUS

## 2019-10-11 MED ORDER — SODIUM CHLORIDE 0.9% FLUSH: 3.0000 mL | Freq: Once | INTRAVENOUS | Status: DC

## 2019-10-11 MED ORDER — DIPHENHYDRAMINE HCL 50 MG/ML IJ SOLN
12.5000 mg | Freq: Once | INTRAMUSCULAR | Status: AC
  Administered 2019-10-11: 19:00:00 12.5 mg via INTRAVENOUS
  Filled 2019-10-11: qty 1

## 2019-10-11 MED ORDER — METOCLOPRAMIDE HCL 5 MG/ML IJ SOLN
10.0000 mg | Freq: Once | INTRAMUSCULAR | Status: AC
  Administered 2019-10-11: 17:00:00 10 mg via INTRAVENOUS
  Filled 2019-10-11: qty 2

## 2019-10-11 MED ORDER — ACETAMINOPHEN 325 MG PO TABS
650.0000 mg | ORAL_TABLET | Freq: Once | ORAL | Status: AC
  Administered 2019-10-11: 650 mg via ORAL
  Filled 2019-10-11: qty 2

## 2019-10-11 MED ORDER — ONDANSETRON 4 MG PO TBDP
4.0000 mg | ORAL_TABLET | Freq: Once | ORAL | Status: AC | PRN
  Administered 2019-10-11: 14:00:00 4 mg via ORAL
  Filled 2019-10-11: qty 1

## 2019-10-11 MED ORDER — HYDROCODONE-ACETAMINOPHEN 5-325 MG PO TABS: 1.0000 | ORAL_TABLET | Freq: Two times a day (BID) | ORAL | 0 refills | Status: DC | PRN

## 2019-10-11 MED ORDER — ONDANSETRON 8 MG PO TBDP: 8.0000 mg | ORAL_TABLET | Freq: Three times a day (TID) | ORAL | 0 refills | Status: DC | PRN

## 2019-10-11 NOTE — ED Triage Notes (Signed)
Patient reports that she woke this AM with headache, emesis, abdominal lcramping, and diarrhea. Patient denies any fever .

## 2019-10-11 NOTE — ED Provider Notes (Signed)
[facility] DEPT Provider Note   CSN: [id number] Arrival date & time: 10/11/19  1310     History Chief Complaint  Patient presents with  . Headache  . Emesis  . Abdominal Pain  . Diarrhea    Susan Grimes is a 73 y.o. female with a history of cholecystectomy, tubal ligation, anxiety, depression, & CKD who presents to the ED with complaints of headache, N/V/D, and abdominal pain that began this AM. Patient went to bed last night feeling relatively normal, woke up this AM with a fairly bad headache located to the frontal region & the top of the head, aching in nature, constant, no alleviating/aggravating factors, feels like prior really bad sinus headache and she is having nasal congestion. Reports shortly after she developed N/V/D, has had 4 episodes of emesis, not able to keep anything down, as well as a few episodes of loose stools which has been without blood/mucous. She is having constant lower abdominal cramping that is worse prior to a bowel movement. Has had some hot flashes but no fevers. Denies cough, dyspnea, chest pain, visual disturbance, numbness, weakness, dizziness, recent head injury, melena, hematochezia, hematemesis, dysuria, urgency, or frequency. Her daughter was seen in the ED yesterday for similar sxs, she had a covid swab done which is pending. Her daughter was briefly in contact with a neighbor within the past week who tested positive for covid. No other known exposures.   HPI     Past Medical History:  Diagnosis Date  . Anxiety   . Chronic pain   . Depression   . Eczema   . Hiatal hernia   . Lumbar back pain   . Osteoporosis   . Renal disorder 06/2013   CKD stage 3    Patient Active Problem List   Diagnosis Date Noted  . Low back pain 07/02/2014  . Paresthesia 07/02/2014    Past Surgical History:  Procedure Laterality Date  . BACK SURGERY    . CHOLECYSTECTOMY    . THYROIDECTOMY, PARTIAL    . TONSILLECTOMY    . TUBAL  LIGATION       OB History   No obstetric history on file.     Family History  Problem Relation Age of Onset  . Emphysema Mother   . Stroke Father     Social History   Tobacco Use  . Smoking status: Never Smoker  . Smokeless tobacco: Never Used  Substance Use Topics  . Alcohol use: Yes    Comment: occ  . Drug use: No    Home Medications Prior to Admission medications   Medication Sig Start Date End Date Taking? Authorizing Provider  acetaminophen (TYLENOL) 500 MG tablet Take 1,000 mg by mouth every 6 (six) hours as needed for moderate pain.    [provider]  alendronate (FOSAMAX) 70 MG tablet Take 70 mg by mouth every Sunday. Take with a full glass of water on an empty stomach.    [provider]  cetirizine (ZYRTEC) 10 MG tablet Take 10 mg by mouth daily as needed for allergies.    [provider]  cholecalciferol (VITAMIN D) 1000 UNITS tablet Take 1,000 Units by mouth daily.    [provider]  docusate sodium (COLACE) 100 MG capsule Take 1 capsule (100 mg total) by mouth every 12 (twelve) hours. Patient not taking: Reported on 03/06/2015 05/04/14   Dorie Rank, MD  HYDROcodone-acetaminophen (NORCO/VICODIN) 5-325 MG per tablet Take 1 tablet by mouth every 4 (  four) hours as needed. 03/06/15   Britt Bottom, NP  hydroxypropyl methylcellulose / hypromellose (ISOPTO TEARS / GONIOVISC) 2.5 % ophthalmic solution Place 1 drop into both eyes 3 (three) times daily as needed for dry eyes.    [provider]  lidocaine (LMX) 4 % cream Apply 1 application topically as needed. Patient not taking: Reported on 03/06/2015 07/22/14   Marcial Pacas, MD  nortriptyline (PAMELOR) 25 MG capsule One po qhs xone week, then 2 tabs po qhs Patient not taking: Reported on 03/06/2015 07/22/14   Marcial Pacas, MD  Probiotic Product (ALIGN) 4 MG CAPS Take 4 mg by mouth daily.    [provider]    Allergies    Asa [aspirin] and Fentanyl  Review of Systems     Review of Systems  Constitutional: Negative for fever.       Positive for intermittent hot flashes.   HENT: Positive for congestion. Negative for ear pain and sore throat.   Respiratory: Negative for cough and shortness of breath.   Cardiovascular: Negative for chest pain.  Gastrointestinal: Positive for abdominal pain, diarrhea, nausea and vomiting. Negative for anal bleeding and blood in stool.  Genitourinary: Negative for dysuria and frequency.  Neurological: Positive for headaches. Negative for dizziness, tremors, syncope, facial asymmetry, speech difficulty, weakness and numbness.  All other systems reviewed and are negative.   Physical Exam Updated Vital Signs BP (!) 158/138 (BP Location: Right Arm)   Pulse 94   Temp 97.7 F (36.5 C) (Oral)   Resp 17   Ht 5\' 5"  (1.651 m)   Wt 72.6 kg   SpO2 94%   BMI 26.63 kg/m   Physical Exam Vitals and nursing note reviewed.  Constitutional:      Appearance: She is well-developed. She is not toxic-appearing.     Comments: Actively vomiting throughout exam.   HENT:     Head: Normocephalic and atraumatic.     Nose: Congestion present.     Mouth/Throat:     Pharynx: Oropharynx is clear. Uvula midline.  Eyes:     General:        Right eye: No discharge.        Left eye: No discharge.     Extraocular Movements: Extraocular movements intact.     Conjunctiva/sclera: Conjunctivae normal.     Pupils: Pupils are equal, round, and reactive to light.     Comments: No proptosis   Cardiovascular:     Rate and Rhythm: Normal rate and regular rhythm.  Pulmonary:     Effort: Pulmonary effort is normal. No respiratory distress.     Breath sounds: Normal breath sounds. No wheezing, rhonchi or rales.  Abdominal:     General: There is no distension.     Palpations: Abdomen is soft.     Tenderness: There is abdominal tenderness (lower abdomen). There is no guarding or rebound.  Musculoskeletal:     Cervical back: Neck supple. No rigidity.   Skin:    General: Skin is warm and dry.     Findings: No rash.  Neurological:     Mental Status: She is alert.     Comments: Clear speech. CN III-XII grossly intact. Sensation grossly intact x 4. 5/5 symmetric grip strength & strength with plantar/dorsiflexion. Normal finger to nose. Negative pronator drift. Ambulatory.   Psychiatric:        Behavior: Behavior normal.     ED Results / Procedures / Treatments   Labs (all labs ordered are listed, but  only abnormal results are displayed) Labs Reviewed  COMPREHENSIVE METABOLIC PANEL - Abnormal; Notable for the following components:      Result Value   Sodium 134 (*)    Glucose, Bld 121 (*)    Total Protein 8.2 (*)    Anion gap <3 (*)    All other components within normal limits  LIPASE, BLOOD  CBC  URINALYSIS, ROUTINE W REFLEX MICROSCOPIC  POC SARS CORONAVIRUS 2 AG -  ED    EKG None  Radiology No results found.  Procedures Procedures (including critical care time)  Medications Ordered in ED Medications  sodium chloride flush (NS) 0.9 % injection 3 mL (has no administration in time range)  ondansetron (ZOFRAN-ODT) disintegrating tablet 4 mg (4 mg Oral Given 10/11/19 1408)    ED Course/MDM I have reviewed the triage vital signs and the nursing notes.  Pertinent labs & imaging results that were available during my care of the patient were reviewed by me and considered in my medical decision making (see chart for details).   Patient presents to the ED with complaints of headache, N/V/D & abdominal pain that began this AM. Her daughter is sick with similar symptoms and had recent covid exposure, patient herself has no known direct exposures. She is nontoxic appearing, actively vomiting on initial assessment, vitals WNL with the exception of elevated BP. No neuro deficits. Abdomen is soft with lower abdominal tenderness without peritoneal signs. Labs per triage reviewed:  CBC: No leukocytosis, leukopenia, or anemia. CMP:  mild hyponatremia @ 134, no significant electrolyte derangement, renal function & LFTs WNL.  Lipase: WNL  Will obtain rapid covid swab as this could be cause of her sxs, start fluids, administer reglan, & re-assess.   COVID testing negative.   17:40: RE-EVAL: Patient feeling restless/overheated s/p reglan, remains with head/abdominal discomfort. Will obtain CT head & abdomen/pelvis wo contrast & administer benadryl & tylenol. Findings & plan of care discussed with supervising physician Dr. Kathrynn Humble who is in agreement.   17:40: Patient care signed out to Dr. Kathrynn Humble @ change of shift pending imaging, re-eval, & disposition.   NUALA WOEHRLE was evaluated in Emergency Department on 10/11/2019 for the symptoms described in the history of present illness. He/she was evaluated in the context of the global COVID-19 pandemic, which necessitated consideration that the patient might be at risk for infection with the SARS-CoV-2 virus that causes COVID-19. Institutional protocols and algorithms that pertain to the evaluation of patients at risk for COVID-19 are in a state of rapid change based on information released by regulatory bodies including the CDC and federal and state organizations. These policies and algorithms were followed during the patient's care in the ED.   Final Clinical Impression(s) / ED Diagnoses Final diagnoses:  None    Rx / DC Orders ED Discharge Orders    None       Amaryllis Dyke, PA-C 10/11/19 [place], Ankit, MD 10/19/19 [phone]

## 2020-01-12 ENCOUNTER — Encounter: Payer: Self-pay | Admitting: Gastroenterology

## 2020-02-12 ENCOUNTER — Ambulatory Visit: Payer: Medicare Other | Admitting: Gastroenterology

## 2020-02-12 ENCOUNTER — Other Ambulatory Visit (INDEPENDENT_AMBULATORY_CARE_PROVIDER_SITE_OTHER): Payer: Medicare Other

## 2020-02-12 VITALS — BP 138/86 | HR 80 | Temp 98.2°F | Ht 63.39 in | Wt 166.0 lb

## 2020-02-12 DIAGNOSIS — R195 Other fecal abnormalities: Secondary | ICD-10-CM | POA: Diagnosis not present

## 2020-02-12 DIAGNOSIS — K529 Noninfective gastroenteritis and colitis, unspecified: Secondary | ICD-10-CM | POA: Diagnosis not present

## 2020-02-12 DIAGNOSIS — R1030 Lower abdominal pain, unspecified: Secondary | ICD-10-CM

## 2020-02-12 LAB — COMPREHENSIVE METABOLIC PANEL
ALT: 11 U/L (ref 0–35)
AST: 16 U/L (ref 0–37)
Albumin: 4.1 g/dL (ref 3.5–5.2)
Alkaline Phosphatase: 66 U/L (ref 39–117)
BUN: 13 mg/dL (ref 6–23)
CO2: 30 mEq/L (ref 19–32)
Calcium: 9.4 mg/dL (ref 8.4–10.5)
Chloride: 102 mEq/L (ref 96–112)
Creatinine, Ser: 1.03 mg/dL (ref 0.40–1.20)
GFR: 52.19 mL/min — ABNORMAL LOW (ref >60.00)
Glucose, Bld: 90 mg/dL (ref 70–99)
Potassium: 4.4 mEq/L (ref 3.5–5.1)
Sodium: 138 mEq/L (ref 135–145)
Total Bilirubin: 0.5 mg/dL (ref 0.2–1.2)
Total Protein: 7.4 g/dL (ref 6.0–8.3)

## 2020-02-12 LAB — CBC WITH DIFFERENTIAL/PLATELET
Basophils Absolute: 0.1 10*3/uL (ref 0.0–0.1)
Basophils Relative: 0.7 % (ref 0.0–3.0)
Eosinophils Absolute: 0.2 10*3/uL (ref 0.0–0.7)
Eosinophils Relative: 2.4 % (ref 0.0–5.0)
HCT: 37.3 % (ref 36.0–46.0)
Hemoglobin: 12.3 g/dL (ref 12.0–15.0)
Lymphocytes Relative: 28 % (ref 12.0–46.0)
Lymphs Abs: 2.8 10*3/uL (ref 0.7–4.0)
MCHC: 32.9 g/dL (ref 30.0–36.0)
MCV: 89.6 fl (ref 78.0–100.0)
Monocytes Absolute: 0.9 10*3/uL (ref 0.1–1.0)
Monocytes Relative: 9 % (ref 3.0–12.0)
Neutro Abs: 5.9 10*3/uL (ref 1.4–7.7)
Neutrophils Relative %: 59.9 % (ref 43.0–77.0)
Platelets: 337 10*3/uL (ref 150.0–400.0)
RBC: 4.16 Mil/uL (ref 3.87–5.11)
RDW: 15.3 % (ref 11.5–15.5)
WBC: 9.9 10*3/uL (ref 4.0–10.5)

## 2020-02-12 NOTE — Patient Instructions (Addendum)
If you are age 76 or older, your body mass index should be between 23-30. Your Body mass index is 29.05 kg/m. If this is out of the aforementioned range listed, please consider follow up with your Primary Care Provider.  If you are age 53 or younger, your body mass index should be between 19-25. Your Body mass index is 29.05 kg/m. If this is out of the aformentioned range listed, please consider follow up with your Primary Care Provider.   You have been scheduled for an endoscopy and colonoscopy. Please follow the written instructions given to you at your visit today. Please pick up your prep supplies at the pharmacy within the next 1-3 days. If you use inhalers (even only as needed), please bring them with you on the day of your procedure.  Your provider has requested that you go to the basement level for lab work before leaving today. Press "B" on the elevator. The lab is located at the first door on the left as you exit the elevator.   It was a pleasure to see you today!  Dr. Loletha Carrow

## 2020-02-12 NOTE — Progress Notes (Signed)
[facility] Consult Note:  History: Susan Grimes 02/12/2020  Referring provider: Sonia Side., FNP [facility] health)  Reason for consult/chief complaint: positive FOBT, Melena, Bloated, Gas, Diarrhea (with frequrncy), and Abdominal Cramping (lower abd, worse when having to have a BM)   Subjective  HPI:  This is a very pleasant 74 year old woman referred by primary care for multiple GI symptoms and heme positive stool.  For the last few months she has had crampy lower abdominal pain, bloating and diarrhea with 4-5 semiformed to loose BMs per day with urgency.  She describes dark and sometimes black stool at most recent primary care clinic visit a month ago, was reportedly heme positive with clinical concern for possible melena.  She says the stool still dark but not as much as it was at that time.  Appetite generally good, weight increasing.  Denies dysphagia, odynophagia, chronic nausea or vomiting. Lanijah has years of frequent heartburn requiring daily use of acid suppression medicine.  For years she was on Zantac before it was removed from the market, more recently put on pantoprazole by primary care.  Former smoker Denies aspirin or NSAID use. Had a colonoscopy and possibly EGD as well at least 10 years ago by Dr. Penelope Coop. ROS:  Review of Systems  Constitutional: Negative for appetite change and unexpected weight change.  HENT: Negative for mouth sores and voice change.   Eyes: Negative for pain and redness.  Respiratory: Negative for cough and shortness of breath.   Cardiovascular: Negative for chest pain and palpitations.  Genitourinary: Negative for dysuria and hematuria.  Musculoskeletal: Positive for back pain. Negative for arthralgias and myalgias.  Skin: Negative for pallor and rash.  Allergic/Immunologic: Positive for environmental allergies.  Neurological: Negative for weakness and headaches.  Hematological: Negative for adenopathy.    Psychiatric/Behavioral:       Anxiety     Past Medical History: Past Medical History:  Diagnosis Date  . Allergic rhinitis   . Anxiety   . Chronic back pain   . Chronic pain   . COPD (chronic obstructive pulmonary disease) ([place])   . Depression   . Eczema   . Gallstones   . GERD (gastroesophageal reflux disease)   . Herniated intervertebral disc of lumbar spine   . Hiatal hernia   . HTN (hypertension)   . Insomnia   . Lumbar back pain   . Osteoarthritis   . Osteoporosis   . Renal disorder 06/2013   CKD stage 3  . Thyroid cyst      Past Surgical History: Past Surgical History:  Procedure Laterality Date  . CHOLECYSTECTOMY    . LUMBAR DISC SURGERY    . THYROIDECTOMY, PARTIAL    . TONSILLECTOMY    . TUBAL LIGATION       Family History: Family History  Problem Relation Age of Onset  . Emphysema Mother   . Heart failure Mother   . Stroke Father   . Heart attack Brother   . Heart attack Brother   . Heart disease Brother     Social History: Social History   Socioeconomic History  . Marital status: Divorced    Spouse name: Not on file  . Number of children: 1  . Years of education: 79  . Highest education level: Not on file  Occupational History  . Occupation: retired  Tobacco Use  . Smoking status: Former Smoker    Types: Cigarettes    Quit date: 1993    Years  since quitting: 28.3  . Smokeless tobacco: Never Used  Substance and Sexual Activity  . Alcohol use: Yes    Comment: occ  . Drug use: No  . Sexual activity: Not on file  Other Topics Concern  . Not on file  Social History Narrative   Patient is single with one child.   Patient is right handed.   Patient has high school education.   Patient drinks 3 cups daily.   Social Determinants of Health   Financial Resource Strain:   . Difficulty of Paying Living Expenses:   Food Insecurity:   . Worried About Charity fundraiser in the Last Year:   . Arboriculturist in the Last Year:    Transportation Needs:   . Film/video editor (Medical):   Marland Kitchen Lack of Transportation (Non-Medical):   Physical Activity:   . Days of Exercise per Week:   . Minutes of Exercise per Session:   Stress:   . Feeling of Stress :   Social Connections:   . Frequency of Communication with Friends and Family:   . Frequency of Social Gatherings with Friends and Family:   . Attends Religious Services:   . Active Member of Clubs or Organizations:   . Attends Archivist Meetings:   Marland Kitchen Marital Status:     Allergies: Allergies  Allergen Reactions  . Asa [Aspirin] Other (See Comments)    Bruses, and thins her blood  . Fentanyl Nausea And Vomiting and Other (See Comments)    Patch---headache    Outpatient Meds: Current Outpatient Medications  Medication Sig Dispense Refill  . albuterol (VENTOLIN HFA) 108 (90 Base) MCG/ACT inhaler Inhale 1 puff into the lungs as needed.    . desonide (DESOWEN) 0.05 % cream Apply 1 application topically daily.    . diphenhydrAMINE (BENADRYL) 25 MG tablet Take 25 mg by mouth at bedtime as needed.    . Fluticasone-Salmeterol (ADVAIR) 250-50 MCG/DOSE AEPB Inhale 1 puff into the lungs as needed.    . hydrOXYzine (ATARAX/VISTARIL) 50 MG tablet Take 50-100 mg by mouth at bedtime as needed.    Marland Kitchen losartan (COZAAR) 25 MG tablet Take 25 mg by mouth daily.    . montelukast (SINGULAIR) 10 MG tablet Take 1 tablet by mouth every evening.    . Multiple Vitamin (MULTIVITAMIN) tablet Take 1 tablet by mouth daily.    . pantoprazole (PROTONIX) 40 MG tablet Take 40 mg by mouth daily.    . traMADol (ULTRAM) 50 MG tablet Take 50 mg by mouth every 6 (six) hours as needed.    . triamcinolone lotion (KENALOG) 0.1 % APPLY SMALL AMOUNT TOPICALLY TO AFFECTED AREA TWICE DAILY FOR 2 WEEKS ON THEN 2 WEEKS OFF. REPEAT FOR 6 WEEKS. APPOINTMENT REQUIRED FOR FUTU    . triamcinolone ointment (KENALOG) 0.5 % APPLY A THIN LAYER OF OINTMENT TOPICALLY TO AFFECTED AREA THREE TIMES DAILY FOR  DRY SKIN     No current facility-administered medications for this visit.      ___________________________________________________________________ Objective   Exam:  BP 138/86 (BP Location: Left Arm, Patient Position: Sitting, Cuff Size: Normal)   Pulse 80   Temp 98.2 F (36.8 C)   Ht 5' 3.39" (1.61 m) Comment: height measured without shoes  Wt 166 lb (75.3 kg)   BMI 29.05 kg/m    General: Well-appearing, normal vocal quality.  Gets on exam table without assistance.  Eyes: sclera anicteric, no redness  ENT: oral mucosa moist without lesions, no cervical  or supraclavicular lymphadenopathy.  Dentures  CV: RRR without murmur, S1/S2, no JVD, no peripheral edema  Resp: clear to auscultation bilaterally, normal RR and effort noted  GI: soft, no tenderness, with active bowel sounds. No guarding or palpable organomegaly noted.  Skin; warm and dry, no rash or jaundice noted  Neuro: awake, alert and oriented x 3. Normal gross motor function and fluent speech  Labs:  No data for review.  Cherrell tells me she had lab work done a month ago with primary care, but did not hear about the results.  Assessment: Encounter Diagnoses  Name Primary?  . Heme positive stool Yes  . Chronic diarrhea   . Lower abdominal pain     Months of lower abdominal pain, diarrhea and reportedly heme positive stool. Years of reflux symptoms improved on acid suppression therapy.  Former smoker.  Plan:  EGD and colonoscopy.  She is agreeable after discussion of procedure and risks.  The benefits and risks of the planned procedure were described in detail with the patient or (when appropriate) their health care proxy.  Risks were outlined as including, but not limited to, bleeding, infection, perforation, adverse medication reaction leading to cardiac or pulmonary decompensation, pancreatitis (if ERCP).  The limitation of incomplete mucosal visualization was also discussed.  No guarantees or warranties  were given.  CBC and CMP today  Thank you for the courtesy of this consult.  Please call me with any questions or concerns.  Nelida Meuse III  CC: Referring provider noted above

## 2020-02-19 ENCOUNTER — Encounter: Payer: Self-pay | Admitting: Gastroenterology

## 2020-02-19 ENCOUNTER — Other Ambulatory Visit: Payer: Self-pay

## 2020-02-19 ENCOUNTER — Ambulatory Visit (AMBULATORY_SURGERY_CENTER): Payer: Medicare Other | Admitting: Gastroenterology

## 2020-02-19 VITALS — BP 149/81 | HR 77 | Temp 97.3°F | Resp 20 | Ht 63.0 in | Wt 166.0 lb

## 2020-02-19 DIAGNOSIS — D123 Benign neoplasm of transverse colon: Secondary | ICD-10-CM

## 2020-02-19 DIAGNOSIS — D124 Benign neoplasm of descending colon: Secondary | ICD-10-CM | POA: Diagnosis not present

## 2020-02-19 DIAGNOSIS — R195 Other fecal abnormalities: Secondary | ICD-10-CM

## 2020-02-19 DIAGNOSIS — K529 Noninfective gastroenteritis and colitis, unspecified: Secondary | ICD-10-CM | POA: Diagnosis not present

## 2020-02-19 DIAGNOSIS — K219 Gastro-esophageal reflux disease without esophagitis: Secondary | ICD-10-CM

## 2020-02-19 DIAGNOSIS — D122 Benign neoplasm of ascending colon: Secondary | ICD-10-CM | POA: Diagnosis not present

## 2020-02-19 DIAGNOSIS — R1030 Lower abdominal pain, unspecified: Secondary | ICD-10-CM

## 2020-02-19 MED ORDER — SODIUM CHLORIDE 0.9 % IV SOLN: 500.0000 mL | Freq: Once | INTRAVENOUS | Status: DC

## 2020-02-19 NOTE — Progress Notes (Signed)
Called to room to assist during endoscopic procedure.  Patient ID and intended procedure confirmed with present staff. Received instructions for my participation in the procedure from the performing physician.  

## 2020-02-19 NOTE — Op Note (Signed)
[facility] Patient Name: Susan Grimes Procedure Date: 02/19/2020 10:36 AM MRN: [id number] Endoscopist: [place]. Loletha Carrow , MD Age: 74 Referring MD:  Date of Birth: 10-17-1946 Gender: Female Account #: [id number] Procedure:                Upper GI endoscopy Indications:              Esophageal reflux symptoms that recur despite                            appropriate therapy, Heme positive stool (at PCP                            clinic visit with reported black stool), Diarrhea                            (several months symptoms. Recent CBC and CMP normal) Medicines:                Monitored Anesthesia Care Procedure:                Pre-Anesthesia Assessment:                           - Prior to the procedure, a History and Physical                            was performed, and patient medications and                            allergies were reviewed. The patient's tolerance of                            previous anesthesia was also reviewed. The risks                            and benefits of the procedure and the sedation                            options and risks were discussed with the patient.                            All questions were answered, and informed consent                            was obtained. Prior Anticoagulants: The patient has                            taken no previous anticoagulant or antiplatelet                            agents. ASA Grade Assessment: II - A patient with                            mild systemic disease. After reviewing the risks  and benefits, the patient was deemed in                            satisfactory condition to undergo the procedure.                           After obtaining informed consent, the endoscope was                            passed under direct vision. Throughout the                            procedure, the patient's blood pressure, pulse, and                            oxygen  saturations were monitored continuously. The                            Endoscope was introduced through the mouth, and                            advanced to the second part of duodenum. The upper                            GI endoscopy was accomplished without difficulty.                            The patient tolerated the procedure well. Scope In: Scope Out: Findings:                 The examined esophagus was normal except for                            somewhat patulous LES.                           There is no endoscopic evidence of Barrett's                            esophagus, esophagitis or stricture in the entire                            esophagus.                           The stomach was normal.                           The cardia and gastric fundus were normal on                            retroflexion.                           Normal mucosa was found in the entire duodenum.  Biopsies for histology were taken with a cold                            forceps for evaluation of celiac disease. Complications:            No immediate complications. Estimated Blood Loss:     Estimated blood loss was minimal. Impression:               - Normal esophagus.                           - Normal stomach.                           - Normal mucosa was found in the entire examined                            duodenum. Biopsied. Recommendation:           - Patient has a contact number available for                            emergencies. The signs and symptoms of potential                            delayed complications were discussed with the                            patient. Return to normal activities tomorrow.                            Written discharge instructions were provided to the                            patient.                           - Resume previous diet.                           - Continue present medications.                            - Await pathology results.                           - See the other procedure note for documentation of                            additional recommendations. Tyrian Peart L. Loletha Carrow, MD 02/19/2020 11:25:10 AM This report has been signed electronically.

## 2020-02-19 NOTE — Progress Notes (Signed)
VS- Courtney Washington Temp- LS  Pt has received both doses of covid vaccie

## 2020-02-19 NOTE — Progress Notes (Signed)
Report to PACU, RN, vss, BBS= Clear.  

## 2020-02-19 NOTE — Patient Instructions (Signed)
YOU HAD AN ENDOSCOPIC PROCEDURE TODAY AT THE [facility]:   Refer to the procedure report that was given to you for any specific questions about what was found during the examination.  If the procedure report does not answer your questions, please call your gastroenterologist to clarify.  If you requested that your care partner not be given the details of your procedure findings, then the procedure report has been included in a sealed envelope for you to review at your convenience later.  YOU SHOULD EXPECT: Some feelings of bloating in the abdomen. Passage of more gas than usual.  Walking can help get rid of the air that was put into your GI tract during the procedure and reduce the bloating. If you had a lower endoscopy (such as a colonoscopy or flexible sigmoidoscopy) you may notice spotting of blood in your stool or on the toilet paper. If you underwent a bowel prep for your procedure, you may not have a normal bowel movement for a few days.  Please Note:  You might notice some irritation and congestion in your nose or some drainage.  This is from the oxygen used during your procedure.  There is no need for concern and it should clear up in a day or so.  SYMPTOMS TO REPORT IMMEDIATELY:   Following lower endoscopy (colonoscopy or flexible sigmoidoscopy):  Excessive amounts of blood in the stool  Significant tenderness or worsening of abdominal pains  Swelling of the abdomen that is new, acute  Fever of 100F or higher   Following upper endoscopy (EGD)  Vomiting of blood or coffee ground material  New chest pain or pain under the shoulder blades  Painful or persistently difficult swallowing  New shortness of breath  Fever of 100F or higher  Black, tarry-looking stools  For urgent or emergent issues, a gastroenterologist can be reached at any hour by calling [phone]. Do not use MyChart messaging for urgent concerns.    DIET:  We do recommend a small meal at first, but  then you may proceed to your regular diet.  Drink plenty of fluids but you should avoid alcoholic beverages for 24 hours.  ACTIVITY:  You should plan to take it easy for the rest of today and you should NOT DRIVE or use heavy machinery until tomorrow (because of the sedation medicines used during the test).    FOLLOW UP: Our staff will call the number listed on your records 48-72 hours following your procedure to check on you and address any questions or concerns that you may have regarding the information given to you following your procedure. If we do not reach you, we will leave a message.  We will attempt to reach you two times.  During this call, we will ask if you have developed any symptoms of COVID 19. If you develop any symptoms (ie: fever, flu-like symptoms, shortness of breath, cough etc.) before then, please call [phone].  If you test positive for Covid 19 in the 2 weeks post procedure, please call and report this information to us.    If any biopsies were taken you will be contacted by phone or by letter within the next 1-3 weeks.  Please call us at [phone] if you have not heard about the biopsies in 3 weeks.    SIGNATURES/CONFIDENTIALITY: You and/or your care partner have signed paperwork which will be entered into your electronic medical record.  These signatures attest to the fact that that the information above on   your After Visit Summary has been reviewed and is understood.  Full responsibility of the confidentiality of this discharge information lies with you and/or your care-partner. 

## 2020-02-19 NOTE — Op Note (Signed)
[facility] Patient Name: Susan Grimes Procedure Date: 02/19/2020 10:36 AM MRN: [id number] Endoscopist: Devola. Loletha Carrow , MD Age: 74 Referring MD:  Date of Birth: 1946/04/22 Gender: Female Account #: [id number] Procedure:                Colonoscopy Indications:              Lower abdominal pain, Heme positive stool (at                            primary care appointment with reported black                            stool), Chronic diarrhea (several months symptoms,                            recent CBC and CMP normal) Medicines:                Monitored Anesthesia Care Procedure:                Pre-Anesthesia Assessment:                           - Prior to the procedure, a History and Physical                            was performed, and patient medications and                            allergies were reviewed. The patient's tolerance of                            previous anesthesia was also reviewed. The risks                            and benefits of the procedure and the sedation                            options and risks were discussed with the patient.                            All questions were answered, and informed consent                            was obtained. Prior Anticoagulants: The patient has                            taken no previous anticoagulant or antiplatelet                            agents. ASA Grade Assessment: II - A patient with                            mild systemic disease. After reviewing the risks  and benefits, the patient was deemed in                            satisfactory condition to undergo the procedure.                           After obtaining informed consent, the colonoscope                            was passed under direct vision. Throughout the                            procedure, the patient's blood pressure, pulse, and                            oxygen saturations were monitored  continuously. The                            Colonoscope was introduced through the anus and                            advanced to the the terminal ileum, with                            identification of the appendiceal orifice and IC                            valve. The colonoscopy was performed without                            difficulty. The patient tolerated the procedure                            well. The quality of the bowel preparation was                            good. The terminal ileum, ileocecal valve,                            appendiceal orifice, and rectum were photographed.                            The bowel preparation used was Miralax. Scope In: 10:58:22 AM Scope Out: 11:19:49 AM Scope Withdrawal Time: 0 hours 15 minutes 59 seconds  Total Procedure Duration: 0 hours 21 minutes 27 seconds  Findings:                 The perianal and digital rectal examinations were                            normal.                           The terminal ileum appeared normal.  Normal mucosa was found in the entire colon.                            Biopsies for histology were taken with a cold                            forceps from the right colon and left colon for                            evaluation of microscopic colitis. (Jar 2)                           A diminutive polyp was found in the hepatic                            flexure. The polyp was sessile. The polyp was                            removed with a cold biopsy forceps. Resection and                            retrieval were complete. (Jar 3)                           Two sessile polyps were found in the descending                            colon and transverse colon. The polyps were 4 to 6                            mm in size. These polyps were removed with a cold                            snare. Resection and retrieval were complete. (Jar                            3)                            The exam was otherwise without abnormality on                            direct and retroflexion views. Complications:            No immediate complications. Estimated Blood Loss:     Estimated blood loss was minimal. Impression:               - The examined portion of the ileum was normal.                           - Normal mucosa in the entire examined colon.                            Biopsied.                           -  One diminutive polyp at the hepatic flexure,                            removed with a cold biopsy forceps. Resected and                            retrieved.                           - Two 4 to 6 mm polyps in the descending colon and                            in the transverse colon, removed with a cold snare.                            Resected and retrieved.                           - The examination was otherwise normal on direct                            and retroflexion views.                           No cause for heme positive stool found on either                            EGD or colonoscopy today. Patient is not anemic. Recommendation:           - Patient has a contact number available for                            emergencies. The signs and symptoms of potential                            delayed complications were discussed with the                            patient. Return to normal activities tomorrow.                            Written discharge instructions were provided to the                            patient.                           - Resume previous diet.                           - Continue present medications.                           - Await pathology results.                           -  Based on current guidelines, no repeat routine                            surveillance colonoscopy due to age and today's                            findings.                           - See the other procedure note for documentation of                             additional recommendations.(EGD)                           - Return to my office at appointment to be                            scheduled. Nik Gorrell L. Loletha Carrow, MD 02/19/2020 11:30:56 AM This report has been signed electronically.

## 2020-02-23 ENCOUNTER — Telehealth: Payer: Self-pay | Admitting: *Deleted

## 2020-02-23 NOTE — Telephone Encounter (Signed)
  Follow up Call-  Call back number 02/19/2020  Post procedure Call Back phone  # [phone]  Permission to leave phone message Yes  Some recent data might be hidden     Patient questions:  Message left to call us if necessary. Second call.

## 2020-02-23 NOTE — Telephone Encounter (Signed)
First attempt, left VM.  

## 2020-02-25 ENCOUNTER — Encounter: Payer: Self-pay | Admitting: Gastroenterology

## 2020-02-26 ENCOUNTER — Other Ambulatory Visit: Payer: Self-pay

## 2020-02-26 MED ORDER — DICYCLOMINE HCL 10 MG PO CAPS
10.0000 mg | ORAL_CAPSULE | Freq: Two times a day (BID) | ORAL | 0 refills | Status: DC
Start: 2020-02-26 — End: 2020-12-28

## 2020-03-24 ENCOUNTER — Ambulatory Visit (INDEPENDENT_AMBULATORY_CARE_PROVIDER_SITE_OTHER): Payer: Medicare Other | Admitting: Gastroenterology

## 2020-03-24 ENCOUNTER — Encounter: Payer: Self-pay | Admitting: Gastroenterology

## 2020-03-24 VITALS — BP 112/62 | HR 81 | Ht 63.0 in | Wt 167.6 lb

## 2020-03-24 DIAGNOSIS — R1030 Lower abdominal pain, unspecified: Secondary | ICD-10-CM

## 2020-03-24 DIAGNOSIS — R12 Heartburn: Secondary | ICD-10-CM

## 2020-03-24 DIAGNOSIS — R195 Other fecal abnormalities: Secondary | ICD-10-CM

## 2020-03-24 DIAGNOSIS — K529 Noninfective gastroenteritis and colitis, unspecified: Secondary | ICD-10-CM | POA: Diagnosis not present

## 2020-03-24 DIAGNOSIS — D126 Benign neoplasm of colon, unspecified: Secondary | ICD-10-CM

## 2020-03-24 DIAGNOSIS — R140 Abdominal distension (gaseous): Secondary | ICD-10-CM | POA: Diagnosis not present

## 2020-03-24 MED ORDER — FAMOTIDINE 40 MG PO TABS
40.0000 mg | ORAL_TABLET | Freq: Two times a day (BID) | ORAL | 0 refills | Status: DC
Start: 2020-03-24 — End: 2020-12-28

## 2020-03-24 MED ORDER — METRONIDAZOLE 250 MG PO TABS
250.0000 mg | ORAL_TABLET | Freq: Three times a day (TID) | ORAL | 0 refills | Status: AC
Start: 2020-03-24 — End: 2020-04-03

## 2020-03-24 NOTE — Progress Notes (Signed)
[facility] Progress Note  Chief Complaint: Lower abdominal pain and diarrhea  Subjective  History: Recent office consult for few months of lower abdominal pain, bloating, diarrhea, reportedly heme positive at PCP with normal hemoglobin. Recent EGD unremarkable and biopsies negative for celiac sprue., colonoscopy to the terminal ileum good preparation, 3 diminutive adenomas and biopsies negative for microscopic colitis. Dicyclomine prescribed, which has helped "a little".  Dejae is here with her daughter today. She still has chronic bloating, which has been going on for years and she previously had constipation until several months ago. Multiple new meds were started including pantoprazole. She is still having a few semiformed to loose BMs per day with some urgency. Heartburn under good control on pantoprazole. She had previously taken OTC famotidine without much improvement.  ROS: Cardiovascular:  no chest pain Respiratory: no dyspnea Remainder of systems negative except as above The patient's Past Medical, Family and Social History were reviewed and are on file in the EMR.  Objective:  Med list reviewed  Current Outpatient Medications:  .  albuterol (VENTOLIN HFA) 108 (90 Base) MCG/ACT inhaler, Inhale 1 puff into the lungs as needed., Disp: , Rfl:  .  desonide (DESOWEN) 0.05 % cream, Apply 1 application topically daily., Disp: , Rfl:  .  dicyclomine (BENTYL) 10 MG capsule, Take 1 capsule (10 mg total) by mouth 2 (two) times daily., Disp: 60 capsule, Rfl: 0 .  diphenhydrAMINE (BENADRYL) 25 MG tablet, Take 25 mg by mouth at bedtime as needed., Disp: , Rfl:  .  Fluticasone-Salmeterol (ADVAIR) 250-50 MCG/DOSE AEPB, Inhale 1 puff into the lungs as needed., Disp: , Rfl:  .  hydrOXYzine (ATARAX/VISTARIL) 50 MG tablet, Take 50-100 mg by mouth at bedtime as needed., Disp: , Rfl:  .  losartan (COZAAR) 25 MG tablet, Take 25 mg by mouth daily., Disp: , Rfl:  .  montelukast (SINGULAIR)  10 MG tablet, Take 1 tablet by mouth every evening., Disp: , Rfl:  .  Multiple Vitamin (MULTIVITAMIN) tablet, Take 1 tablet by mouth daily., Disp: , Rfl:  .  pantoprazole (PROTONIX) 40 MG tablet, Take 40 mg by mouth daily., Disp: , Rfl:  .  traMADol (ULTRAM) 50 MG tablet, Take 50 mg by mouth every 6 (six) hours as needed., Disp: , Rfl:  .  triamcinolone lotion (KENALOG) 0.1 %, APPLY SMALL AMOUNT TOPICALLY TO AFFECTED AREA TWICE DAILY FOR 2 WEEKS ON THEN 2 WEEKS OFF. REPEAT FOR 6 WEEKS. APPOINTMENT REQUIRED FOR FUTU, Disp: , Rfl:  .  famotidine (PEPCID) 40 MG tablet, Take 1 tablet (40 mg total) by mouth 2 (two) times daily., Disp: 60 tablet, Rfl: 0 .  metroNIDAZOLE (FLAGYL) 250 MG tablet, Take 1 tablet (250 mg total) by mouth 3 (three) times daily for 10 days., Disp: 30 tablet, Rfl: 0   Vital signs in last 24 hrs: Vitals:   03/24/20 1459  BP: 112/62  Pulse: 81  SpO2: 97%    Physical Exam  Well-appearing  HEENT: sclera anicteric, oral mucosa moist without lesions  Neck: supple, no thyromegaly, JVD or lymphadenopathy  Cardiac: RRR without murmurs, S1S2 heard, no peripheral edema  Pulm: clear to auscultation bilaterally, normal RR and effort noted  Abdomen: soft, no tenderness, with active bowel sounds. No guarding or palpable hepatosplenomegaly.  Skin; warm and dry, no jaundice or rash  Labs:    ___________________________________________ Radiologic studies:   ____________________________________________ Other: Polyp results as noted above.: Biopsy negative microscopic colitis, small bowel biopsies negative for celiac sprue.  _____________________________________________ Assessment & Plan  Assessment: Encounter Diagnoses  Name Primary?  . Lower abdominal pain Yes  . Heme positive stool   . Chronic diarrhea   . Abdominal bloating   . Adenomatous polyp of colon, unspecified part of colon   . Heartburn    Chronic abdominal bloating and constipation, but change to  diarrhea a few months ago. I suspect it may be from her PPI. Less likely bacterial overgrowth without typical risk factors. Chronic heartburn under good control on once daily PPI. No source for heme positive stool seen. Hemoglobin is normal, suspect false positive. Plan: Written dietary advice for bloating was given Stop taking dicyclomine for now Stop taking pantoprazole for now Famotidine 40 mg twice daily No surveillance colonoscopy necessary at her age. Trial of metronidazole 250 mg 3 times daily x10 days in case she has SIBO.   30 minutes were spent on this encounter (including chart review, history/exam, counseling/coordination of care, and documentation)  Susan Grimes

## 2020-03-24 NOTE — Patient Instructions (Addendum)
If you are age 76 or older, your body mass index should be between 23-30. Your Body mass index is 29.69 kg/m. If this is out of the aforementioned range listed, please consider follow up with your Primary Care Provider.  If you are age 2 or younger, your body mass index should be between 19-25. Your Body mass index is 29.69 kg/m. If this is out of the aformentioned range listed, please consider follow up with your Primary Care Provider.    Food Guidelines for those with chronic digestive trouble:  Many people have difficulty digesting certain foods, causing a variety of distressing and embarrassing symptoms such as abdominal pain, bloating and gas.  These foods may need to be avoided or consumed in small amounts.  Here are some tips that might be helpful for you.  1.   Lactose intolerance is the difficulty or complete inability to digest lactose, the natural sugar in milk and anything made from milk.  This condition is harmless, common, and can begin any time during life.  Some people can digest a modest amount of lactose while others cannot tolerate any.  Also, not all dairy products contain equal amounts of lactose.  For example, hard cheeses such as parmesan have less lactose than soft cheeses such as cheddar.  Yogurt has less lactose than milk or cheese.  Many packaged foods (even many brands of bread) have milk, so read ingredient lists carefully.  It is difficult to test for lactose intolerance, so just try avoiding lactose as much as possible for a week and see what happens with your symptoms.  If you seem to be lactose intolerant, the best plan is to avoid it (but make sure you get calcium from another source).  The next best thing is to use lactase enzyme supplements, available over the counter everywhere.  Just know that many lactose intolerant people need to take several tablets with each serving of dairy to avoid symptoms.  Lastly, a lot of restaurant food is made with milk or butter.  Many  are things you might not suspect, such as mashed potatoes, rice and pasta (cooked with butter) and "grilled" items.  If you are lactose intolerant, it never hurts to ask your server what has milk or butter.  2.   Fiber is an important part of your diet, but not all fiber is well-tolerated.  Insoluble fiber such as bran is often consumed by normal gut bacteria and converted into gas.  Soluble fiber such as oats, squash, carrots and green beans are typically tolerated better.  3.   Some types of carbohydrates can be poorly digested.  Examples include: fructose (apples, cherries, pears, raisins and other dried fruits), fructans (onions, zucchini, large amounts of wheat), sorbitol/mannitol/xylitol and sucralose/Splenda (common artificial sweeteners), and raffinose (lentils, broccoli, cabbage, asparagus, brussel sprouts, many types of beans).  Do a Development worker, community for [employer] and you will find helpful information. Beano, a dietary supplement, will often help with raffinose-containing foods.  As with lactase tablets, you may need several per serving.  4.   Whenever possible, avoid processed food&meats and chemical additives.  High fructose corn syrup, a common sweetener, may be difficult to digest.  Eggs and soy (comes from the soybean, and added to many foods now) are other common bloating/gassy foods.  5.  Regarding gluten:  gluten is a protein mainly found in wheat, but also rye and barley.  There is a condition called celiac sprue, which is an inflammatory reaction in the small  intestine causing a variety of digestive symptoms.  Blood testing is highly reliable to look for this condition, and sometimes upper endoscopy with small bowel biopsies may be necessary to make the diagnosis.  Many patients who test negative for celiac sprue report improvement in their digestive symptoms when they switch to a gluten-free diet.  However, in these "non-celiac gluten sensitive" patients, the true role of gluten in their  symptoms is unclear.  Reducing carbohydrates in general may decrease the gas and bloating caused when gut bacteria consume carbs. Also, some of these patients may actually be intolerant of the baker's yeast in bread products rather than the gluten.  Flatbread and other reduced yeast breads might therefore be tolerated.  There is no specific testing available for most food intolerances, which are discovered mainly by dietary elimination.  Please do not embark on a gluten free diet unless directed by your doctor, as it is highly restrictive, and may lead to nutritional deficiencies if not carefully monitored.  Lastly, beware of internet claims offering "personalized" tests for food intolerances.  Such testing has no reliable scientific evidence to support its reliability and correlation to symptoms.    6.  The best advice is old advice, especially for those with chronic digestive trouble - try to eat "clean".  Balanced diet, avoid processed food, plenty of fruits and vegetables, cut down the sugar, minimal alcohol, avoid tobacco. Make time to care for yourself, get enough sleep, exercise when you can, reduce stress.  Your guts will thank you for it.   - Dr. [facility] ____________________________________  Stop dicyclomine  Take the metronidazole as prescribed  Stop pantoprazole for now while you try famotidine instead  It was a pleasure to see you today!  Dr. Loletha Carrow

## 2020-11-02 ENCOUNTER — Other Ambulatory Visit: Payer: Self-pay | Admitting: Student

## 2020-11-02 DIAGNOSIS — M25551 Pain in right hip: Secondary | ICD-10-CM

## 2020-11-02 DIAGNOSIS — M5441 Lumbago with sciatica, right side: Secondary | ICD-10-CM

## 2020-11-12 ENCOUNTER — Other Ambulatory Visit: Payer: Self-pay | Admitting: Student

## 2020-11-12 DIAGNOSIS — Z780 Asymptomatic menopausal state: Secondary | ICD-10-CM

## 2020-11-18 ENCOUNTER — Other Ambulatory Visit: Payer: Self-pay | Admitting: Family

## 2020-11-18 DIAGNOSIS — M5441 Lumbago with sciatica, right side: Secondary | ICD-10-CM

## 2020-11-21 ENCOUNTER — Other Ambulatory Visit: Payer: Self-pay

## 2020-11-21 ENCOUNTER — Encounter (HOSPITAL_COMMUNITY): Payer: Self-pay

## 2020-11-21 ENCOUNTER — Emergency Department (HOSPITAL_COMMUNITY)
Admission: EM | Admit: 2020-11-21 | Discharge: 2020-11-22 | Disposition: A | Discharge status: Home / Self Care | Payer: Medicare Other | Attending: Emergency Medicine | Admitting: Emergency Medicine

## 2020-11-21 ENCOUNTER — Emergency Department (HOSPITAL_COMMUNITY): Payer: Medicare Other

## 2020-11-21 DIAGNOSIS — Z79899 Other long term (current) drug therapy: Secondary | ICD-10-CM | POA: Insufficient documentation

## 2020-11-21 DIAGNOSIS — R112 Nausea with vomiting, unspecified: Secondary | ICD-10-CM | POA: Diagnosis not present

## 2020-11-21 DIAGNOSIS — Z87891 Personal history of nicotine dependence: Secondary | ICD-10-CM | POA: Diagnosis not present

## 2020-11-21 DIAGNOSIS — K219 Gastro-esophageal reflux disease without esophagitis: Secondary | ICD-10-CM | POA: Diagnosis not present

## 2020-11-21 DIAGNOSIS — S3992XA Unspecified injury of lower back, initial encounter: Secondary | ICD-10-CM | POA: Diagnosis present

## 2020-11-21 DIAGNOSIS — R109 Unspecified abdominal pain: Secondary | ICD-10-CM | POA: Insufficient documentation

## 2020-11-21 DIAGNOSIS — S32030A Wedge compression fracture of third lumbar vertebra, initial encounter for closed fracture: Secondary | ICD-10-CM | POA: Diagnosis not present

## 2020-11-21 DIAGNOSIS — X58XXXA Exposure to other specified factors, initial encounter: Secondary | ICD-10-CM | POA: Insufficient documentation

## 2020-11-21 DIAGNOSIS — N183 Chronic kidney disease, stage 3 unspecified: Secondary | ICD-10-CM | POA: Insufficient documentation

## 2020-11-21 DIAGNOSIS — R000 Tachycardia, unspecified: Secondary | ICD-10-CM | POA: Insufficient documentation

## 2020-11-21 DIAGNOSIS — Z9889 Other specified postprocedural states: Secondary | ICD-10-CM | POA: Diagnosis not present

## 2020-11-21 DIAGNOSIS — J45909 Unspecified asthma, uncomplicated: Secondary | ICD-10-CM | POA: Insufficient documentation

## 2020-11-21 DIAGNOSIS — Z7952 Long term (current) use of systemic steroids: Secondary | ICD-10-CM | POA: Diagnosis not present

## 2020-11-21 DIAGNOSIS — J449 Chronic obstructive pulmonary disease, unspecified: Secondary | ICD-10-CM | POA: Diagnosis not present

## 2020-11-21 DIAGNOSIS — I129 Hypertensive chronic kidney disease with stage 1 through stage 4 chronic kidney disease, or unspecified chronic kidney disease: Secondary | ICD-10-CM | POA: Insufficient documentation

## 2020-11-21 DIAGNOSIS — M545 Low back pain, unspecified: Secondary | ICD-10-CM

## 2020-11-21 DIAGNOSIS — Z9049 Acquired absence of other specified parts of digestive tract: Secondary | ICD-10-CM | POA: Insufficient documentation

## 2020-11-21 LAB — URINALYSIS, ROUTINE W REFLEX MICROSCOPIC
Glucose, UA: NEGATIVE mg/dL
Hgb urine dipstick: NEGATIVE
Ketones, ur: 15 mg/dL — AB
Nitrite: NEGATIVE
Protein, ur: NEGATIVE mg/dL
Specific Gravity, Urine: 1.025 (ref 1.005–1.030)
pH: 5 (ref 5.0–8.0)

## 2020-11-21 LAB — COMPREHENSIVE METABOLIC PANEL
ALT: 20 U/L (ref 0–44)
AST: 16 U/L (ref 15–41)
Albumin: 4.2 g/dL (ref 3.5–5.0)
Alkaline Phosphatase: 112 U/L (ref 38–126)
Anion gap: 16 — ABNORMAL HIGH (ref 5–15)
BUN: 22 mg/dL (ref 8–23)
CO2: 18 mmol/L — ABNORMAL LOW (ref 22–32)
Calcium: 9.2 mg/dL (ref 8.9–10.3)
Chloride: 99 mmol/L (ref 98–111)
Creatinine, Ser: 1.11 mg/dL — ABNORMAL HIGH (ref 0.44–1.00)
GFR, Estimated: 52 mL/min — ABNORMAL LOW (ref >60)
Glucose, Bld: 95 mg/dL (ref 70–99)
Potassium: 4 mmol/L (ref 3.5–5.1)
Sodium: 133 mmol/L — ABNORMAL LOW (ref 135–145)
Total Bilirubin: 1.3 mg/dL — ABNORMAL HIGH (ref 0.3–1.2)
Total Protein: 7.4 g/dL (ref 6.5–8.1)

## 2020-11-21 LAB — PROTIME-INR
INR: 0.9 (ref 0.8–1.2)
Prothrombin Time: 12 seconds (ref 11.4–15.2)

## 2020-11-21 LAB — CBC
HCT: 43 % (ref 36.0–46.0)
Hemoglobin: 14 g/dL (ref 12.0–15.0)
MCH: 29.9 pg (ref 26.0–34.0)
MCHC: 32.6 g/dL (ref 30.0–36.0)
MCV: 91.7 fL (ref 80.0–100.0)
Platelets: 530 10*3/uL — ABNORMAL HIGH (ref 150–400)
RBC: 4.69 MIL/uL (ref 3.87–5.11)
RDW: 15.3 % (ref 11.5–15.5)
WBC: 11.5 10*3/uL — ABNORMAL HIGH (ref 4.0–10.5)
nRBC: 0 % (ref 0.0–0.2)

## 2020-11-21 LAB — ACETAMINOPHEN LEVEL: Acetaminophen (Tylenol), Serum: <10 ug/mL — ABNORMAL LOW (ref 10–30)

## 2020-11-21 LAB — LIPASE, BLOOD: Lipase: 49 U/L (ref 11–51)

## 2020-11-21 IMAGING — CT CT ABD-PELV W/O CM
2 of 4 series · 16 of 46 positions shown, 18 images · non-contrast
Comparison: None.

CLINICAL DATA: Chronic back pain, injury [DATE], nausea

EXAM:
CT ABDOMEN AND PELVIS WITHOUT CONTRAST
TECHNIQUE: Multidetector CT imaging of the abdomen and pelvis was performed
following the standard protocol without IV contrast.

[Series 2: axial st · axial · 0.74mm/px · z∈[+1037,+1412]mm · 13 of 85 slices shown, 15 images]
[im 5/85  soft-tissue]
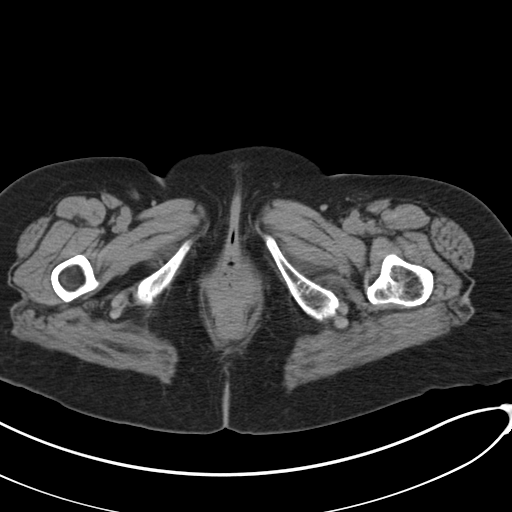
[im 5/85  bone]
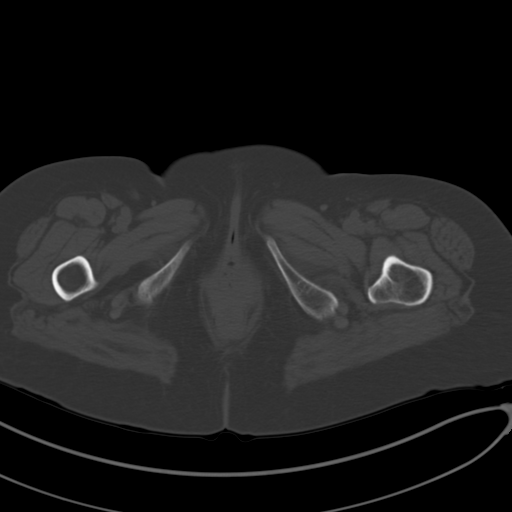
[im 10/85  soft-tissue]
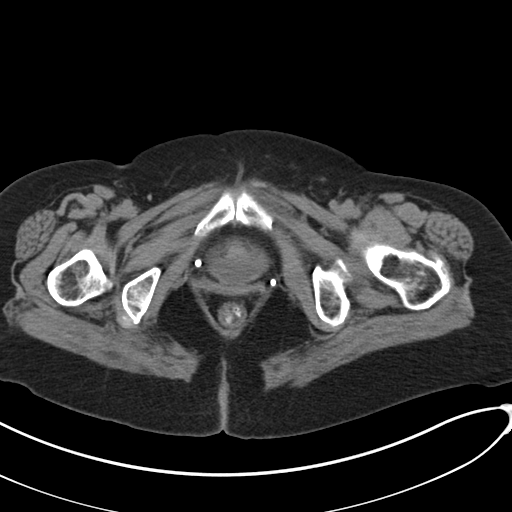
[im 19/85  soft-tissue]
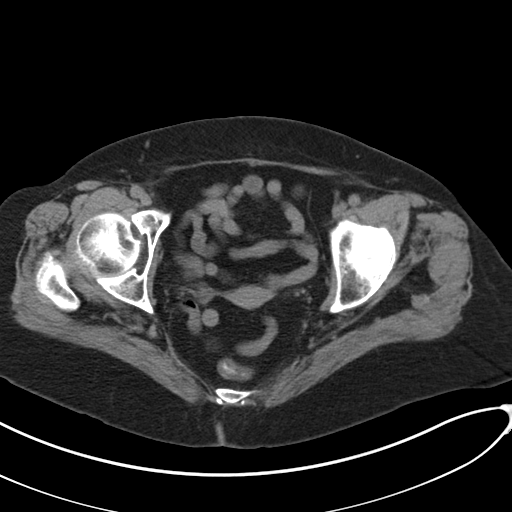
[im 24/85  soft-tissue]
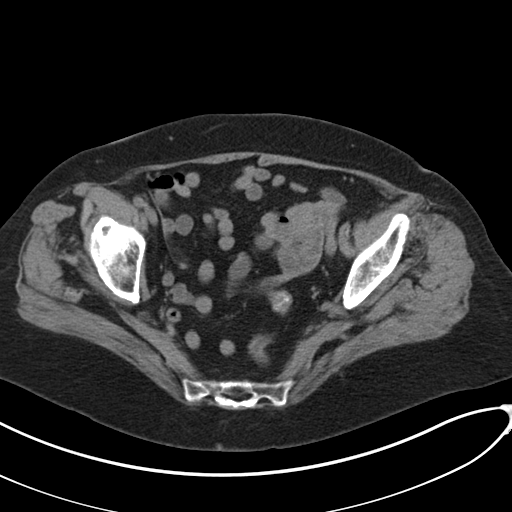
[im 29/85  soft-tissue]
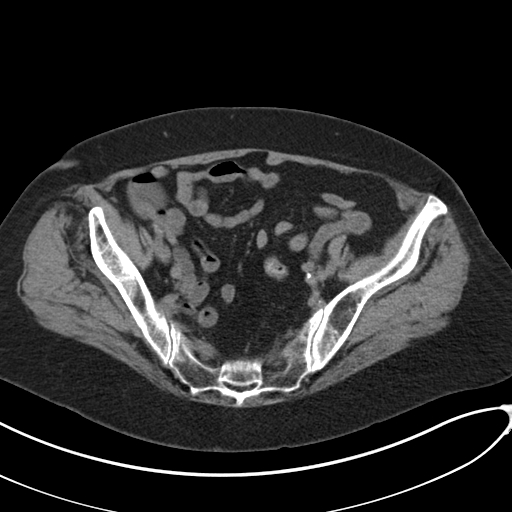
[im 38/85  soft-tissue]
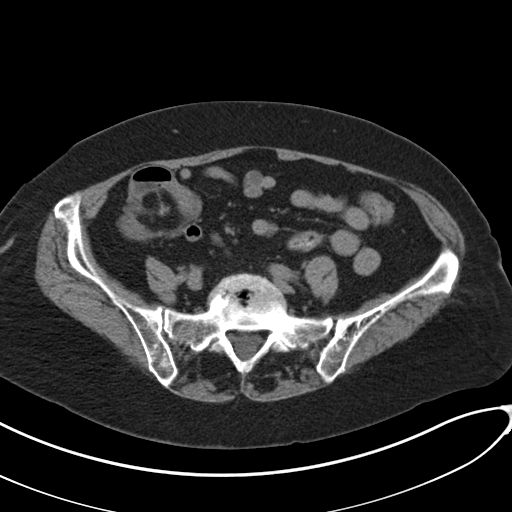
[im 43/85  soft-tissue]
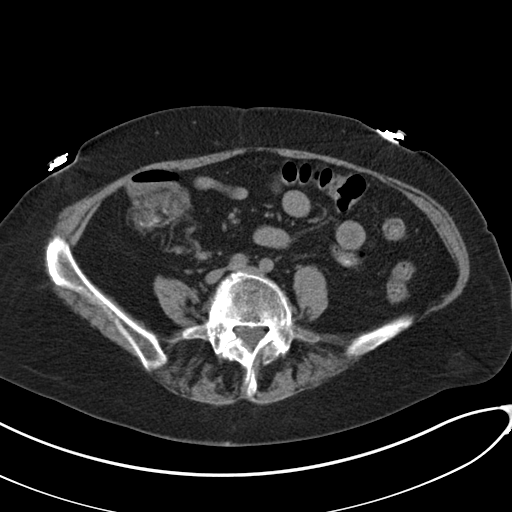
[im 47/85  soft-tissue]
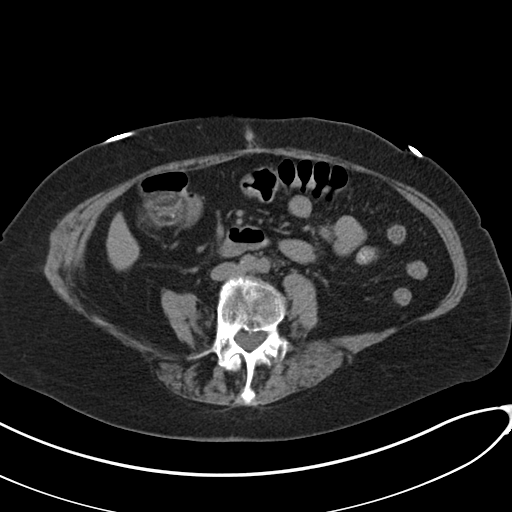
[im 57/85  soft-tissue]
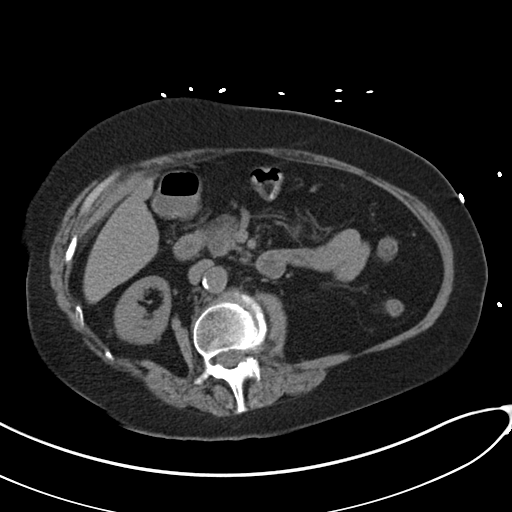
[im 57/85  bone]
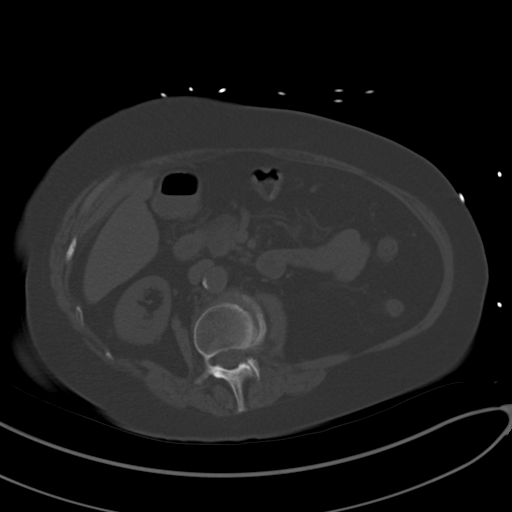
[im 61/85  soft-tissue]
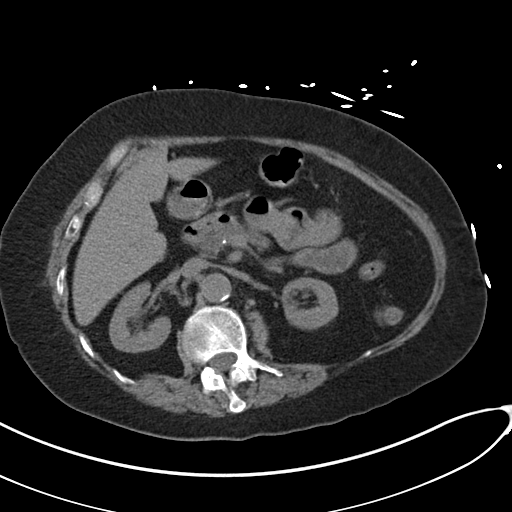
[im 66/85  soft-tissue]
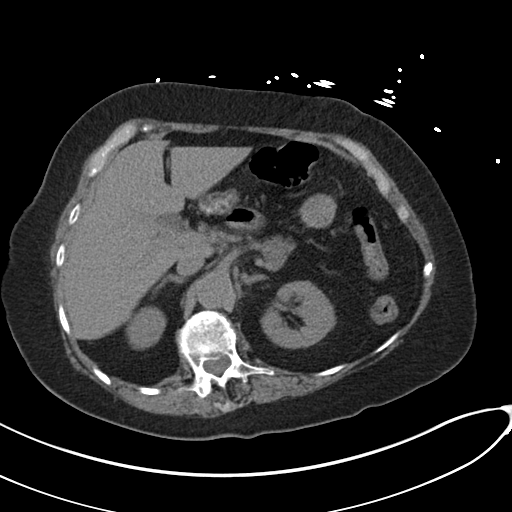
[im 75/85  soft-tissue]
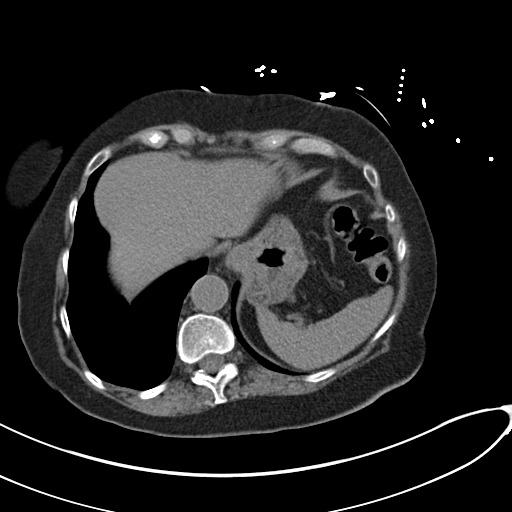
[im 80/85  soft-tissue]
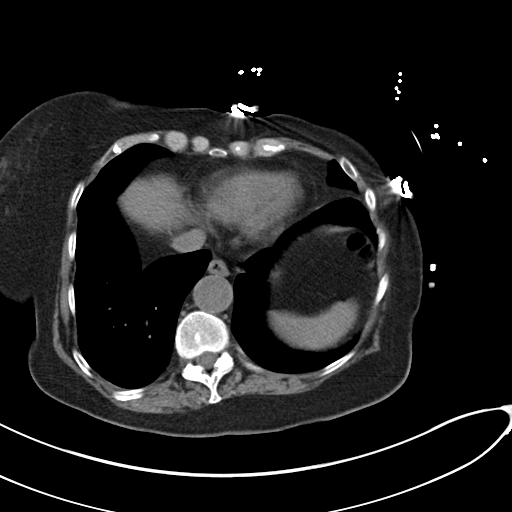

[Series 5: coronal st · coronal · 0.77mm/px · 3 of 126 slices shown]
[im 42/126  soft-tissue]
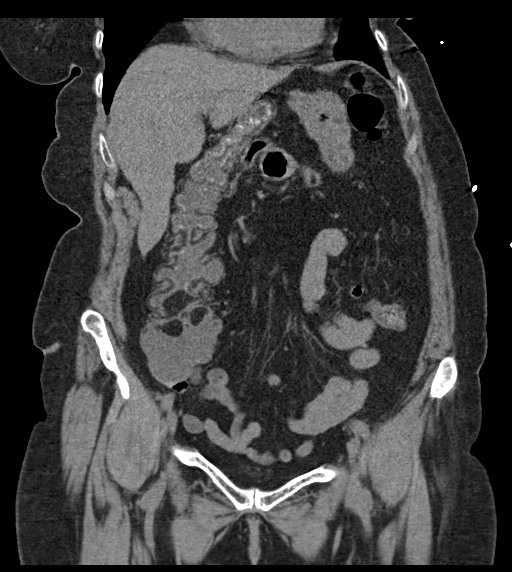
[im 56/126  soft-tissue]
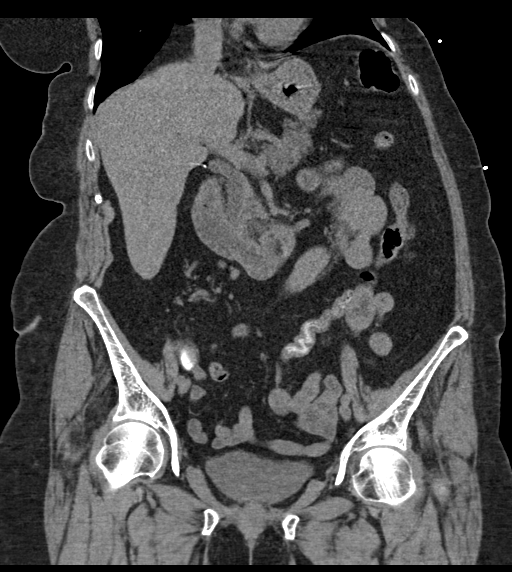
[im 70/126  soft-tissue]
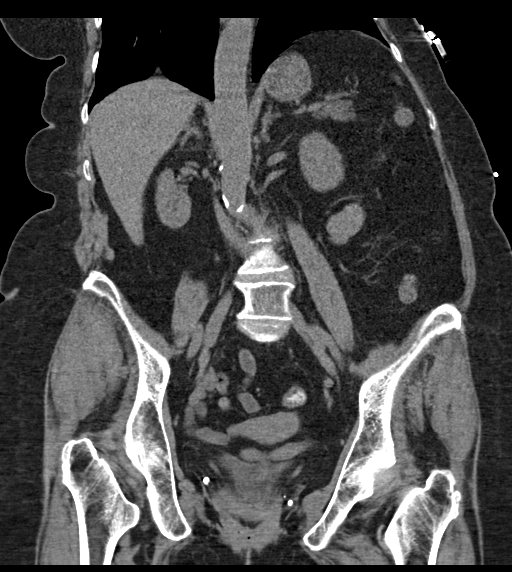

[16 of 46 positions shown; findings below may reference images not displayed]

FINDINGS: Lower chest: No acute pleural or parenchymal lung disease.

Hepatobiliary: No focal liver abnormality is seen. Status post
cholecystectomy. No biliary dilatation.

Pancreas: Unremarkable. No pancreatic ductal dilatation or
surrounding inflammatory changes.

Spleen: Normal in size without focal abnormality.

Adrenals/Urinary Tract: No urinary tract calculi or obstructive
uropathy. The adrenals and bladder are unremarkable.

Stomach/Bowel: No bowel obstruction or ileus. No wall thickening or
inflammatory change. The appendix is unremarkable.

Vascular/Lymphatic: Aortic atherosclerosis. No enlarged abdominal or
pelvic lymph nodes.

Reproductive: Uterus and bilateral adnexa are unremarkable.

Other: No free fluid or free gas.  No abdominal wall hernia.

Musculoskeletal: There are age-indeterminate compression deformities
involving the L1 and L3 vertebral bodies. At the L1 level, there is
mild compression of the superior endplate with approximately 25%
loss of vertebral body height. This has a more chronic appearance.

At L3, there is significant compression of the superior endplate
with greater than 75% loss of height. Fracture line may still
faintly be visible along the left lateral margin, and this could
reflect a subacute fracture. No paraspinal hematoma. Vacuum
phenomenon is seen within the L2/L3 disc space. Retropulsion of the
superior endplate extends 6 mm into the central canal, with moderate
to severe central canal stenosis as a result.

No other acute bony abnormalities. Prominent spondylosis at L3-4,
L4-5, and L5-S1.

Reconstructed images demonstrate no additional findings.
IMPRESSION: 1. Compression deformities at L1 and L3 as above. The L1 fracture
appears chronic, and the L3 fracture appears subacute. Correlation
with any previous imaging is recommended.
2. Otherwise no acute intra-abdominal or intrapelvic process.
3.  Aortic Atherosclerosis ([PY]-[PY]).

## 2020-11-21 MED ORDER — ONDANSETRON 4 MG PO TBDP
4.0000 mg | ORAL_TABLET | Freq: Once | ORAL | Status: AC | PRN
  Administered 2020-11-21: 4 mg via ORAL
  Filled 2020-11-21: qty 1

## 2020-11-21 MED ORDER — MORPHINE SULFATE (PF) 4 MG/ML IV SOLN
4.0000 mg | Freq: Once | INTRAVENOUS | Status: AC
  Administered 2020-11-21: 4 mg via INTRAVENOUS
  Filled 2020-11-21: qty 1

## 2020-11-21 MED ORDER — SODIUM CHLORIDE 0.9 % IV BOLUS
1000.0000 mL | Freq: Once | INTRAVENOUS | Status: AC
  Administered 2020-11-21: 1000 mL via INTRAVENOUS

## 2020-11-21 NOTE — ED Triage Notes (Addendum)
Per EMS- patient has chronic back pain. Patient reports that she hurt her back in December, received pain meds. Patient ran out of pain meds and her PCp will not refill.  Patient reports that she took her Hydrocodone every 4 hours instead of every 8 hours and pain  Med ran out. Patient c/o abdominal pain and nausea.

## 2020-11-21 NOTE — ED Provider Notes (Signed)
[facility] DEPT Provider Note   CSN: [id number] Arrival date & time: 11/21/20  1318     History Chief Complaint  Patient presents with  . Back Pain  . Abdominal Pain  . Nausea    Susan Grimes is a 75 y.o. female.  The history is provided by the patient and medical records.  Back Pain Associated symptoms: abdominal pain   Abdominal Pain  Susan Grimes is a 75 y.o. female who presents to the Emergency Department complaining of back pain and abdominal pain.  She presents to the ED complaining of low back pain that radiates throughout her abdomen since 12/31.   She has a hx/o DJD.  On 12/31 she assisted her daughter lifting a heavy scooter and since that time she has had severe worsening of her pain.  Different than her chronic pain.  She describes it as an injury pain.  Pain is constant but worse with walking.    Went to [facility] 1/3 and took pain meds q4h instead of q8h.  Per PDMP review rx for hydrocodone 5/325 #90 filled on 1/11. Pain is described as a severe ache with abdominal cramping.  Vomited once 2 days ago.    Denies fever, chest pain, sob, diaphoresis, dysuria, leg swelling/pain, numbness, incontinence.  Feels generalized weakness, poor appetite.  Worsening abdominal pain with meals.   Had diarrhea a few weeks ago, now gone.    Ran out of hydrocodone 1/20.  Since 1/21 she started taking apap, 500mg , four tablets q 4-6 hours or a few times a day.  Took four tablets this morning.  Took it three times yesterday.      Past Medical History:  Diagnosis Date  . Allergic rhinitis   . Allergy   . Anemia   . Anxiety   . Asthma   . Cataract   . Chronic back pain   . Chronic pain   . COPD (chronic obstructive pulmonary disease) ([place])   . Depression   . Eczema   . Gallstones   . GERD (gastroesophageal reflux disease)   . Herniated intervertebral disc of lumbar spine   . Hiatal hernia   . HTN (hypertension)   . Insomnia   .  Lumbar back pain   . Osteoarthritis   . Osteoporosis   . Renal disorder 06/2013   CKD stage 3  . Thyroid cyst     Patient Active Problem List   Diagnosis Date Noted  . Low back pain 07/02/2014  . Paresthesia 07/02/2014    Past Surgical History:  Procedure Laterality Date  . CHOLECYSTECTOMY    . COLONOSCOPY    . LUMBAR DISC SURGERY    . THYROIDECTOMY, PARTIAL    . TONSILLECTOMY    . TUBAL LIGATION       OB History   No obstetric history on file.     Family History  Problem Relation Age of Onset  . Emphysema Mother   . Heart failure Mother   . Stroke Father   . Heart attack Brother   . Heart attack Brother   . Heart disease Brother   . Colon cancer Neg Hx   . Esophageal cancer Neg Hx   . Rectal cancer Neg Hx   . Stomach cancer Neg Hx     Social History   Tobacco Use  . Smoking status: Former Smoker    Types: Cigarettes    Quit date: 1993    Years since quitting: 29.0  .  Smokeless tobacco: Never Used  Vaping Use  . Vaping Use: Never used  Substance Use Topics  . Alcohol use: Yes    Comment: occ  . Drug use: No    Home Medications Prior to Admission medications   Medication Sig Start Date End Date Taking? Authorizing Provider  meloxicam (MOBIC) 15 MG tablet Take 1 tablet (15 mg total) by mouth daily. 11/22/20  Yes Quintella Reichert, MD  ondansetron (ZOFRAN ODT) 4 MG disintegrating tablet Take 1 tablet (4 mg total) by mouth every 8 (eight) hours as needed for nausea or vomiting. 11/22/20  Yes Quintella Reichert, MD  albuterol (VENTOLIN HFA) 108 (90 Base) MCG/ACT inhaler Inhale 1 puff into the lungs as needed. 11/12/19   [provider]  desonide (DESOWEN) 0.05 % cream Apply 1 application topically daily. 09/02/19   [provider]  dicyclomine (BENTYL) 10 MG capsule Take 1 capsule (10 mg total) by mouth 2 (two) times daily. 02/26/20   Doran Stabler, MD  diphenhydrAMINE (BENADRYL) 25 MG tablet Take 25 mg by mouth at bedtime as needed.     [provider]  famotidine (PEPCID) 40 MG tablet Take 1 tablet (40 mg total) by mouth 2 (two) times daily. 03/24/20   Doran Stabler, MD  Fluticasone-Salmeterol (ADVAIR) 250-50 MCG/DOSE AEPB Inhale 1 puff into the lungs as needed. 12/07/19   [provider]  hydrOXYzine (ATARAX/VISTARIL) 50 MG tablet Take 50-100 mg by mouth at bedtime as needed. 02/04/20   [provider]  losartan (COZAAR) 25 MG tablet Take 25 mg by mouth daily. 12/03/19   [provider]  montelukast (SINGULAIR) 10 MG tablet Take 1 tablet by mouth every evening. 02/04/20   [provider]  Multiple Vitamin (MULTIVITAMIN) tablet Take 1 tablet by mouth daily.    [provider]  pantoprazole (PROTONIX) 40 MG tablet Take 40 mg by mouth daily. 11/11/19   [provider]  traMADol (ULTRAM) 50 MG tablet Take 50 mg by mouth every 6 (six) hours as needed. 02/09/20   [provider]  triamcinolone lotion (KENALOG) 0.1 % APPLY SMALL AMOUNT TOPICALLY TO AFFECTED AREA TWICE DAILY FOR 2 WEEKS ON THEN 2 WEEKS OFF. REPEAT FOR 6 WEEKS. APPOINTMENT REQUIRED FOR FUTU 12/18/19   [provider]    Allergies    Asa [aspirin] and Fentanyl  Review of Systems   Review of Systems  Gastrointestinal: Positive for abdominal pain.  Musculoskeletal: Positive for back pain.  All other systems reviewed and are negative.   Physical Exam Updated Vital Signs BP (!) 151/94   Pulse (!) 110   Temp 98.4 F (36.9 C) (Oral)   Resp 18   Ht 5\' 5"  (1.651 m)   Wt 77.1 kg   SpO2 96%   BMI 28.29 kg/m   Physical Exam Vitals and nursing note reviewed.  Constitutional:      Appearance: She is well-developed and well-nourished.  HENT:     Head: Normocephalic and atraumatic.  Cardiovascular:     Rate and Rhythm: Regular rhythm. Tachycardia present.     Heart sounds: No murmur heard.   Pulmonary:     Effort: Pulmonary effort is normal. No respiratory distress.     Breath sounds:  Normal breath sounds.  Abdominal:     Palpations: Abdomen is soft.     Tenderness: There is no rebound.     Comments: Mild generalized abdominal tenderness  Musculoskeletal:        General: No tenderness or  edema.     Comments: 2+ DP pulses bilaterally  Skin:    General: Skin is warm and dry.  Neurological:     Mental Status: She is alert and oriented to person, place, and time.     Comments: Five out of five strength in all four extremities.  Psychiatric:        Mood and Affect: Mood and affect normal.     Comments: Anxious, tearful     ED Results / Procedures / Treatments   Labs (all labs ordered are listed, but only abnormal results are displayed) Labs Reviewed  COMPREHENSIVE METABOLIC PANEL - Abnormal; Notable for the following components:      Result Value   Sodium 133 (*)    CO2 18 (*)    Creatinine, Ser 1.11 (*)    Total Bilirubin 1.3 (*)    GFR, Estimated 52 (*)    Anion gap 16 (*)    All other components within normal limits  CBC - Abnormal; Notable for the following components:   WBC 11.5 (*)    Platelets 530 (*)    All other components within normal limits  URINALYSIS, ROUTINE W REFLEX MICROSCOPIC - Abnormal; Notable for the following components:   Bilirubin Urine SMALL (*)    Ketones, ur 15 (*)    Leukocytes,Ua SMALL (*)    Bacteria, UA RARE (*)    All other components within normal limits  ACETAMINOPHEN LEVEL - Abnormal; Notable for the following components:   Acetaminophen (Tylenol), Serum <10 (*)    All other components within normal limits  LIPASE, BLOOD  PROTIME-INR    EKG EKG Interpretation  Date/Time:  Monday November 21 2020 20:08:46 EST Ventricular Rate:  114 PR Interval:    QRS Duration: 75 QT Interval:  317 QTC Calculation: 437 R Axis:   -25 Text Interpretation: Sinus tachycardia Right atrial enlargement Abnormal R-wave progression, early transition Left ventricular hypertrophy 12 Lead; [place] Confirmed by Quintella Reichert [phone])  on 11/21/2020 10:32:08 PM   Radiology CT ABDOMEN PELVIS WO CONTRAST  Result Date: 11/21/2020 CLINICAL DATA:  Chronic back pain, injury 09/2020, nausea EXAM: CT ABDOMEN AND PELVIS WITHOUT CONTRAST TECHNIQUE: Multidetector CT imaging of the abdomen and pelvis was performed following the standard protocol without IV contrast. COMPARISON:  None. FINDINGS: Lower chest: No acute pleural or parenchymal lung disease. Hepatobiliary: No focal liver abnormality is seen. Status post cholecystectomy. No biliary dilatation. Pancreas: Unremarkable. No pancreatic ductal dilatation or surrounding inflammatory changes. Spleen: Normal in size without focal abnormality. Adrenals/Urinary Tract: No urinary tract calculi or obstructive uropathy. The adrenals and bladder are unremarkable. Stomach/Bowel: No bowel obstruction or ileus. No wall thickening or inflammatory change. The appendix is unremarkable. Vascular/Lymphatic: Aortic atherosclerosis. No enlarged abdominal or pelvic lymph nodes. Reproductive: Uterus and bilateral adnexa are unremarkable. Other: No free fluid or free gas.  No abdominal wall hernia. Musculoskeletal: There are age-indeterminate compression deformities involving the L1 and L3 vertebral bodies. At the L1 level, there is mild compression of the superior endplate with approximately 25% loss of vertebral body height. This has a more chronic appearance. At L3, there is significant compression of the superior endplate with greater than 75% loss of height. Fracture line may still faintly be visible along the left lateral margin, and this could reflect a subacute fracture. No paraspinal hematoma. Vacuum phenomenon is seen within the L2/L3 disc space. Retropulsion of the superior endplate extends 6 mm into the central canal, with moderate to severe central canal stenosis as a  result. No other acute bony abnormalities. Prominent spondylosis at L3-4, L4-5, and L5-S1. Reconstructed images demonstrate no additional  findings. IMPRESSION: 1. Compression deformities at L1 and L3 as above. The L1 fracture appears chronic, and the L3 fracture appears subacute. Correlation with any previous imaging is recommended. 2. Otherwise no acute intra-abdominal or intrapelvic process. 3.  Aortic Atherosclerosis (ICD10-I70.0). Electronically Signed   By: Randa Ngo M.D.   On: 11/21/2020 22:33    Procedures Procedures   Medications Ordered in ED Medications  morphine 4 MG/ML injection 4 mg (has no administration in time range)  ondansetron (ZOFRAN-ODT) disintegrating tablet 4 mg (4 mg Oral Given 11/21/20 1336)  sodium chloride 0.9 % bolus 1,000 mL (1,000 mLs Intravenous New Bag/Given 11/21/20 2137)  morphine 4 MG/ML injection 4 mg (4 mg Intravenous Given 11/21/20 2137)    ED Course  I have reviewed the triage vital signs and the nursing notes.  Pertinent labs & imaging results that were available during my care of the patient were reviewed by me and considered in my medical decision making (see chart for details).    MDM Rules/Calculators/A&P                         patient with history of chronic pain here for evaluation of acute worsening of her chronic low back pain after an injury on December 31. She is tachycardic and anxious on ED presentation. She is neurologically intact. Presentation is not consistent with cauda equina or epidural abscess. Suspect she has both elements of pain as well as potential opiate withdrawal and anxiety. She was treated with morphine for pain control pending workup. CT scan does demonstrate subacute L3 compression fracture, likely source of her worsening pain. BMP with mild hyponatremia, bicarb fluid 18, creatinine near her baseline of 1.1 with mildly elevated anion gap at 16. Suspect she has some degree of ketosis secondary to her dehydration and poor oral intake. She is not toxic or septic appearing. Discussed with patient findings of studies, her pain is improved after treatment in the  emergency department. She did overtake acetaminophen in the past, discussed risks of this behavior. No evidence of coagulopathy or fulminant liver failure at this time. She has also overtaken hydrocodone earlier this month. Discussed with patient concern for her misuse of narcotic medications. Discussed that this is very dangerous. She has established with a pain management Center and feel that they are the appropriate service to be managing her pain. Discussed taking meloxicam that was prescribed by the pain management Center, will write a new prescription as patient does not recall receiving one. Discussed with neurosurgery patients presentation and CT findings, TLSo recommended for comfort. Discussed importance of outpatient follow-up as well as return precautions.    Final Clinical Impression(s) / ED Diagnoses Final diagnoses:  Acute midline low back pain without sciatica  Closed compression fracture of L3 vertebra, initial encounter [facility])    Rx / DC Orders ED Discharge Orders         Ordered    ondansetron (ZOFRAN ODT) 4 MG disintegrating tablet  Every 8 hours PRN        11/22/20 0003    meloxicam (MOBIC) 15 MG tablet  Daily        11/22/20 0005           Quintella Reichert, MD 11/22/20 [phone]

## 2020-11-21 NOTE — ED Notes (Signed)
Attempted IV x2 unsuccessful, IV team notified.

## 2020-11-22 MED ORDER — ONDANSETRON 4 MG PO TBDP: 4.0000 mg | ORAL_TABLET | Freq: Three times a day (TID) | ORAL | 0 refills | Status: DC | PRN

## 2020-11-22 MED ORDER — MELOXICAM 15 MG PO TABS: 15.0000 mg | ORAL_TABLET | Freq: Every day | ORAL | 0 refills | Status: DC

## 2020-11-22 MED ORDER — MORPHINE SULFATE (PF) 4 MG/ML IV SOLN
4.0000 mg | Freq: Once | INTRAVENOUS | Status: AC
  Administered 2020-11-22: 4 mg via INTRAVENOUS
  Filled 2020-11-22: qty 1

## 2020-11-22 MED ORDER — MORPHINE SULFATE (PF) 4 MG/ML IV SOLN: 4.0000 mg | Freq: Once | INTRAVENOUS | Status: DC

## 2020-11-22 NOTE — Progress Notes (Signed)
Orthopedic Tech Progress Note Patient Details:  Susan Grimes 09/16/1946 [id number]  Patient ID: Susan Grimes, female   DOB: [birth year], 75 y.o.   MRN: [id number] Called order into [facility].  Karolee Stamps 11/22/2020, 12:19 AM

## 2020-11-22 NOTE — Discharge Instructions (Signed)
You have a compression fracture in your back at L3.  It may be causing your worsening back pain.  Please follow up with Doctor Pool and your pain specialist.  Get rechecked if you develop numbness or new concerning symptoms.

## 2020-11-27 ENCOUNTER — Other Ambulatory Visit: Payer: Self-pay

## 2020-11-27 ENCOUNTER — Ambulatory Visit
Admission: RE | Admit: 2020-11-27 | Discharge: 2020-11-27 | Disposition: A | Discharge status: Home / Self Care | Payer: Medicare Other | Source: Ambulatory Visit | Attending: Student | Admitting: Student

## 2020-11-27 DIAGNOSIS — M5441 Lumbago with sciatica, right side: Secondary | ICD-10-CM

## 2020-11-27 DIAGNOSIS — M25551 Pain in right hip: Secondary | ICD-10-CM

## 2020-11-27 IMAGING — MR MR LUMBAR SPINE WO/W CM
4 of 8 series · 21 of 48 positions shown · IV contrast (multihance)
Comparison: Lumbar MRI [DATE].  CT abdomen pelvis [DATE]

CLINICAL DATA: Low back pain with right leg weakness. History of
back surgery [LW]

EXAM:
MRI LUMBAR SPINE WITHOUT AND WITH CONTRAST
TECHNIQUE: Multiplanar and multiecho pulse sequences of the lumbar spine were
obtained without and with intravenous contrast.
CONTRAST:  15mL MULTIHANCE GADOBENATE DIMEGLUMINE 529 MG/ML IV SOLN

[Series 5: T1 · sagittal · 4.0mm · 1.09mm/px · 5 of 20 slices shown (1 of 2)]
[im 1/20]
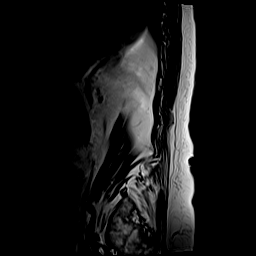
[im 5/20]
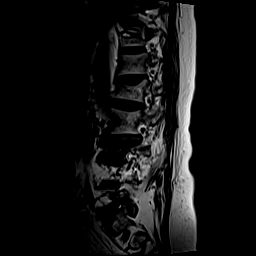
[im 10/20]
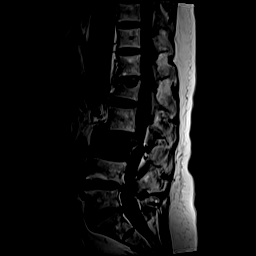
[im 15/20]
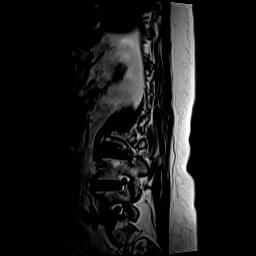
[im 20/20]
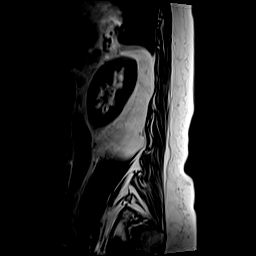

[Series 7: T2 · sagittal · 4.0mm · 0.73mm/px · 4 of 20 slices shown (1 of 2)]
[im 1/20]
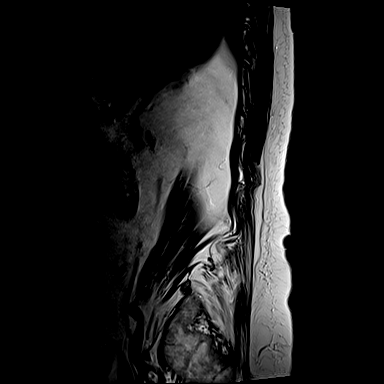
[im 7/20]
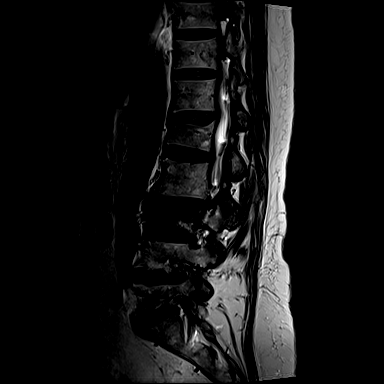
[im 13/20]
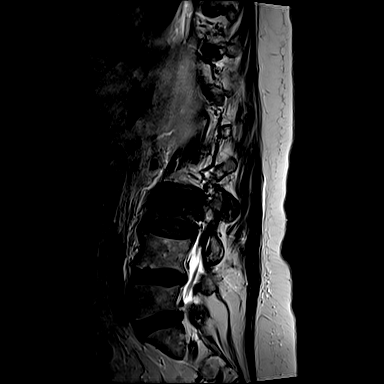
[im 20/20]
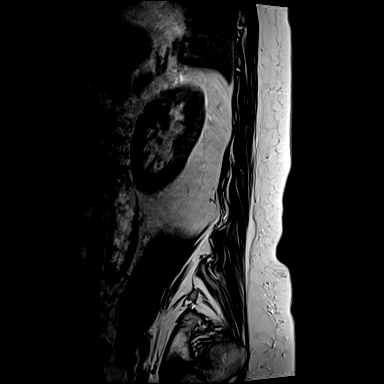

[Series 12: T2 · axial · 4.0mm · 0.28mm/px · z∈[-90,+124]mm · 9 of 44 slices shown (2 of 2)]
[im 1/44]
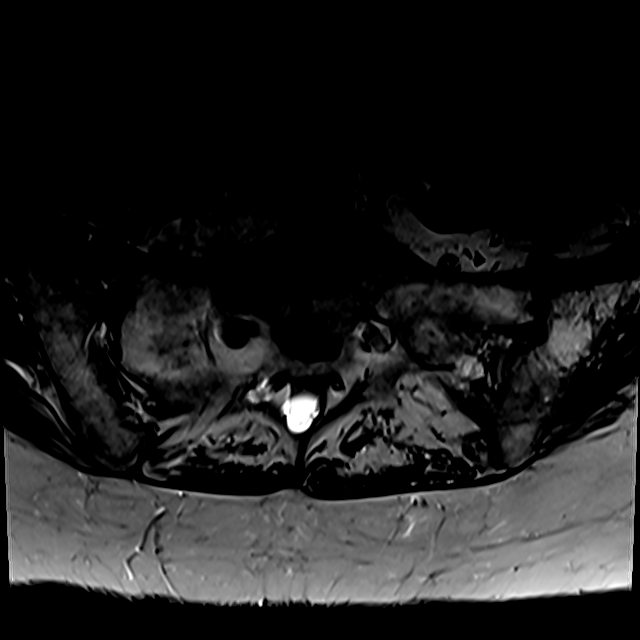
[im 6/44]
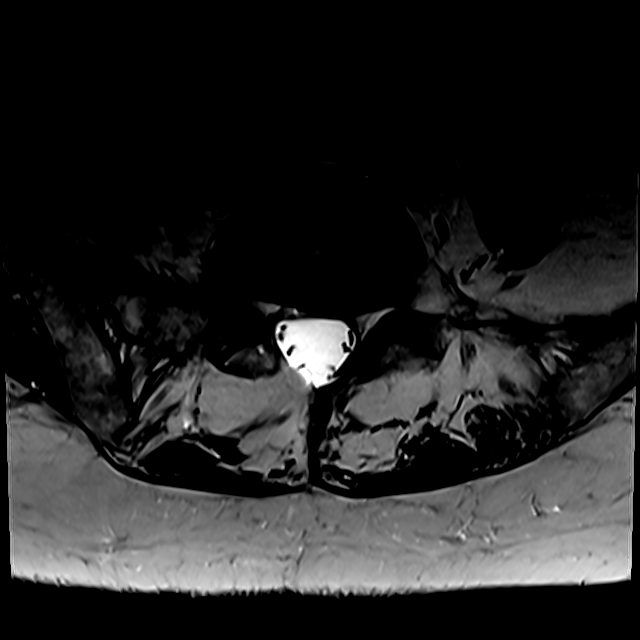
[im 11/44]
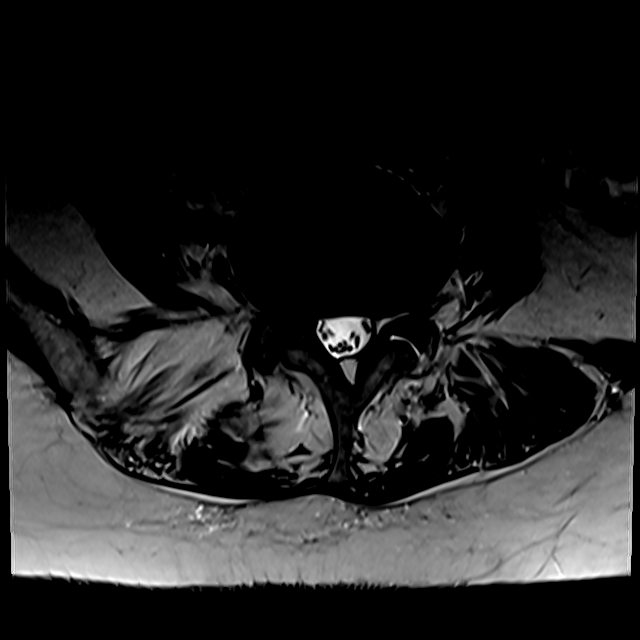
[im 17/44]
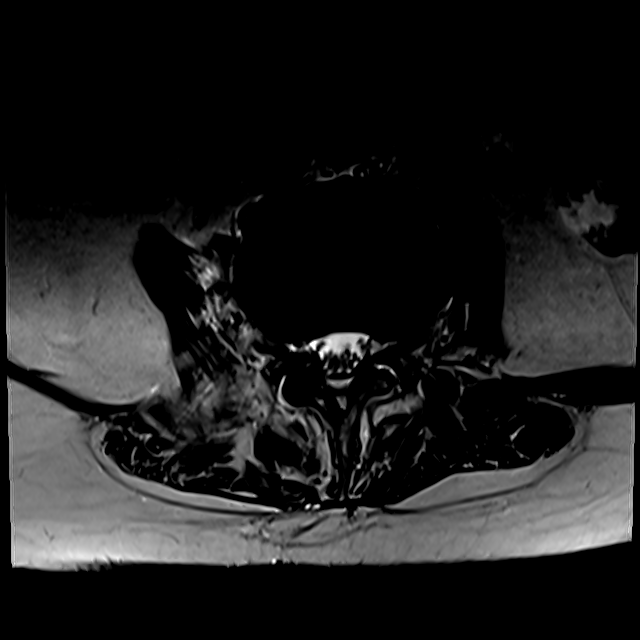
[im 22/44]
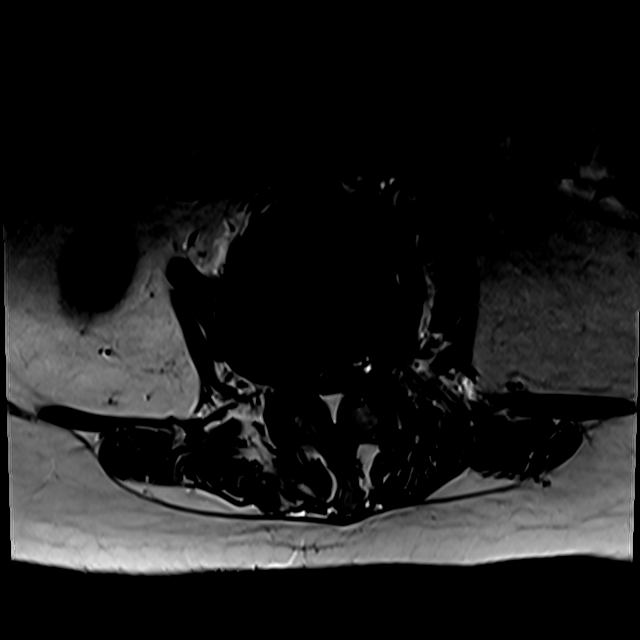
[im 27/44]
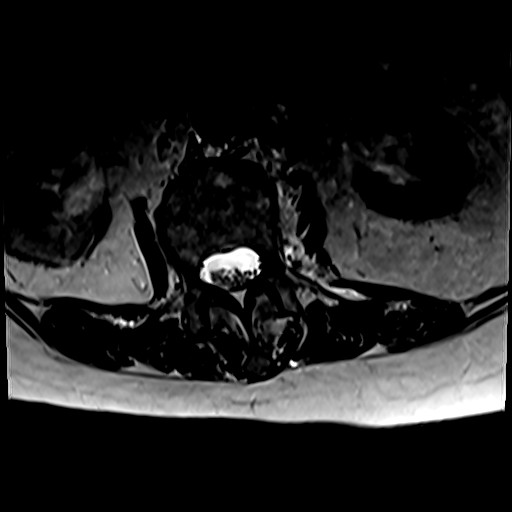
[im 33/44]
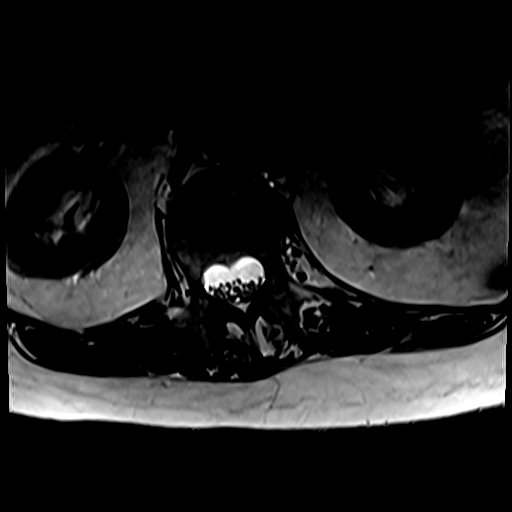
[im 38/44]
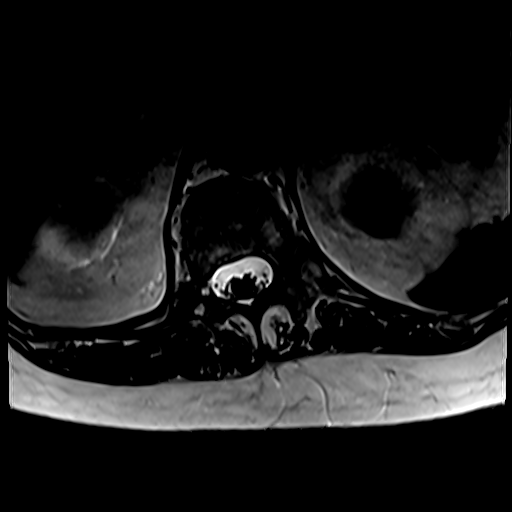
[im 44/44]
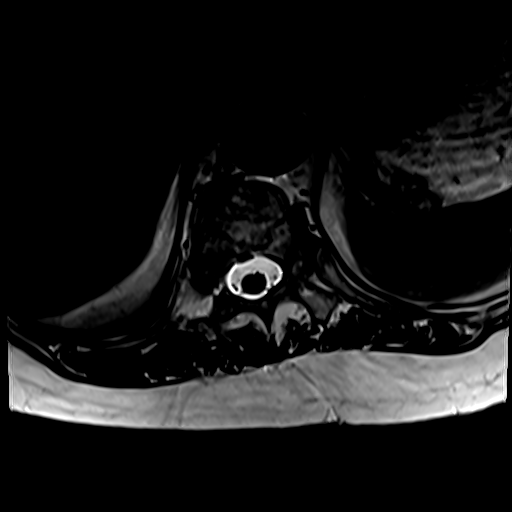

[Series 15: T1 · axial · 4.0mm · 0.28mm/px · z∈[-72,+65]mm · 3 of 39 slices shown (2 of 2)]
[im 6/39]
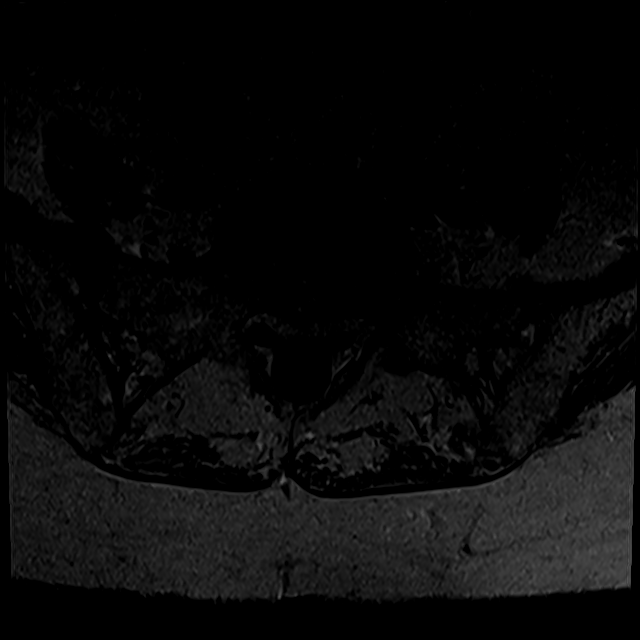
[im 22/39]
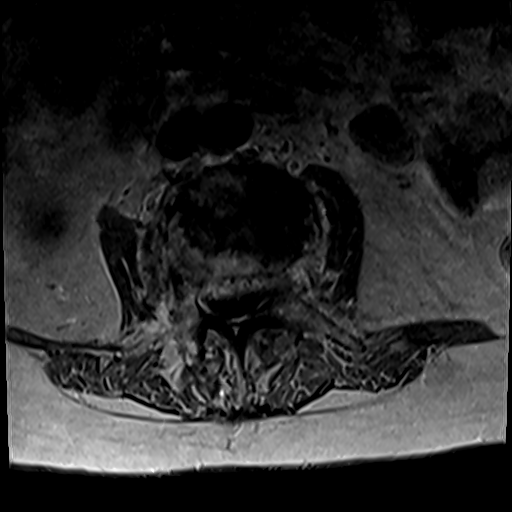
[im 33/39]
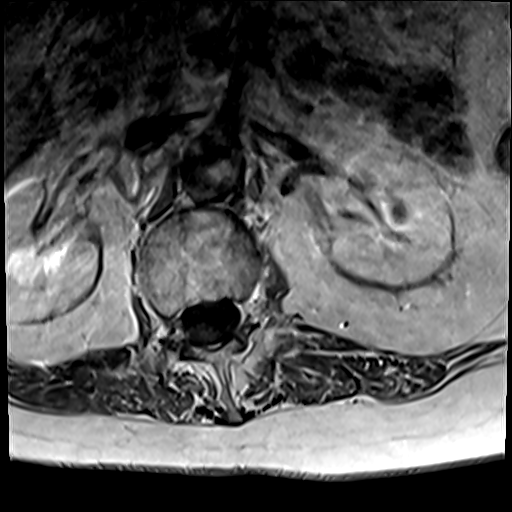

[21 of 48 positions shown; findings below may reference images not displayed]

FINDINGS: Segmentation:  Normal

Alignment:  Normal sagittal alignment.  Mild lumbar scoliosis.

Vertebrae: Chronic compression fracture superior endplate of L1
unchanged from [LW]

Moderate to severe fracture L3 vertebral body with associated bone
marrow edema and enhancement throughout the vertebral body.
Retropulsion of bone into the canal. Canal narrowed by approximately
60% diameter stenosis. No mass lesion in the lumbar spine.

Conus medullaris and cauda equina: Conus extends to the L1 level.
Conus and cauda equina appear normal.

Paraspinal and other soft tissues: Negative for paraspinous mass or
adenopathy or hematoma.

Disc levels:

T12-L1: Mild degenerative change.  Negative for stenosis

L1-2: Negative

L2-3: Disc degeneration with disc space narrowing disc desiccation.
Moderate to severe spinal stenosis due to retropulsion of L3 into
the spinal canal. Bilateral facet hypertrophy contributes to spinal
stenosis. Moderate subarticular stenosis bilaterally

L3-4: Mild disc bulging and mild facet degeneration. Mild
subarticular stenosis bilaterally.

L4-5: Diffuse disc bulging and endplate spurring. Moderate facet
degeneration right greater than left. Severe subarticular and
foraminal stenosis on the right due to spurring. Mild subarticular
stenosis on the left

L5-S1: Right laminectomy. Severe right foraminal encroachment due
to spurring. Left foramen patent.
IMPRESSION: Moderate to severe compression fracture of L3 with bone marrow edema
enhancement compatible with recent healing fracture. Bony
retropulsion in the canal narrows the canal by 60% diameter
stenosis.

Severe subarticular and foraminal stenosis on the right at L4-5.
Severe right foraminal encroachment L5-S1

## 2020-11-27 MED ORDER — GADOBENATE DIMEGLUMINE 529 MG/ML IV SOLN
15.0000 mL | Freq: Once | INTRAVENOUS | Status: AC | PRN
  Administered 2020-11-27: 15 mL via INTRAVENOUS

## 2020-12-15 ENCOUNTER — Other Ambulatory Visit: Payer: Self-pay | Admitting: Neurosurgery

## 2020-12-20 ENCOUNTER — Encounter (HOSPITAL_COMMUNITY): Payer: Self-pay | Admitting: Anesthesiology

## 2020-12-20 ENCOUNTER — Other Ambulatory Visit: Payer: Self-pay

## 2020-12-20 ENCOUNTER — Encounter (HOSPITAL_COMMUNITY): Payer: Self-pay | Admitting: Neurosurgery

## 2020-12-20 NOTE — Anesthesia Preprocedure Evaluation (Deleted)
Anesthesia Evaluation    Reviewed: Allergy & Precautions, Patient's Chart, lab work & pertinent test results  Airway        Dental   Pulmonary asthma , COPD,  COPD inhaler, former smoker,           Cardiovascular hypertension, Pt. on medications      Neuro/Psych PSYCHIATRIC DISORDERS Anxiety Depression negative neurological ROS     GI/Hepatic Neg liver ROS, hiatal hernia, GERD  Medicated,  Endo/Other  negative endocrine ROS  Renal/GU Renal InsufficiencyRenal disease     Musculoskeletal  (+) Arthritis , Fibromyalgia - (Chronic pain), narcotic dependent  Abdominal   Peds  Hematology negative hematology ROS (+)   Anesthesia Other Findings   Reproductive/Obstetrics                             Anesthesia Physical Anesthesia Plan  ASA: III  Anesthesia Plan: General   Post-op Pain Management:    Induction: Intravenous  PONV Risk Score and Plan: 3 and Dexamethasone, Ondansetron and Propofol infusion  Airway Management Planned: Oral ETT  Additional Equipment:   Intra-op Plan:   Post-operative Plan: Extubation in OR  Informed Consent:   Plan Discussed with:   Anesthesia Plan Comments:         Anesthesia Quick Evaluation

## 2020-12-20 NOTE — Progress Notes (Signed)
Susan Grimes denies chest pain or shortness of breath. Patient  tested negative for Covid and has been in quarantine since that time.

## 2020-12-21 ENCOUNTER — Ambulatory Visit (HOSPITAL_COMMUNITY): Admission: RE | Admit: 2020-12-21 | Payer: Medicare Other | Source: Home / Self Care | Admitting: Neurosurgery

## 2020-12-21 HISTORY — DX: Respiratory tuberculosis unspecified: A15.9

## 2020-12-21 HISTORY — DX: Constipation, unspecified: K59.00

## 2020-12-21 SURGERY — LUMBAR LAMINECTOMY/DECOMPRESSION MICRODISCECTOMY 1 LEVEL
Anesthesia: General | Site: Back

## 2020-12-27 ENCOUNTER — Encounter (HOSPITAL_COMMUNITY): Payer: Self-pay | Admitting: Neurosurgery

## 2020-12-27 ENCOUNTER — Other Ambulatory Visit: Payer: Self-pay

## 2020-12-27 NOTE — Progress Notes (Addendum)
Attempted to contact patient for SDW with no answer. Left generic message on phone with instructions listed under preop call. Attempted to contact daughter as well with no answer.   I was able to contact patient. Denies chest pain, shortness of breath, or cardiology visits. Educated on Environmental manager.

## 2020-12-27 NOTE — Anesthesia Preprocedure Evaluation (Addendum)
Anesthesia Evaluation  Patient identified by MRN, date of birth, ID band Patient awake    Reviewed: Allergy & Precautions, NPO status , Patient's Chart, lab work & pertinent test results  History of Anesthesia Complications (+) PONV and history of anesthetic complications  Airway Mallampati: II  TM Distance: >3 FB Neck ROM: Full    Dental  (+) Dental Advisory Given, Teeth Intact   Pulmonary asthma , COPD, former smoker,   Hx TB    Pulmonary exam normal        Cardiovascular hypertension, Pt. on medications Normal cardiovascular exam     Neuro/Psych PSYCHIATRIC DISORDERS Anxiety Depression negative neurological ROS     GI/Hepatic Neg liver ROS, hiatal hernia, GERD  Medicated and Controlled,  Endo/Other  negative endocrine ROS  Renal/GU CRFRenal disease     Musculoskeletal  (+) Arthritis , narcotic dependent  Abdominal   Peds  Hematology negative hematology ROS (+)   Anesthesia Other Findings Covid test negative   Reproductive/Obstetrics                            Anesthesia Physical Anesthesia Plan  ASA: III  Anesthesia Plan: General   Post-op Pain Management:    Induction: Intravenous  PONV Risk Score and Plan: 3 and Treatment may vary due to age or medical condition, Ondansetron and Propofol infusion  Airway Management Planned: Oral ETT  Additional Equipment: None  Intra-op Plan:   Post-operative Plan: Extubation in OR  Informed Consent: I have reviewed the patients History and Physical, chart, labs and discussed the procedure including the risks, benefits and alternatives for the proposed anesthesia with the patient or authorized representative who has indicated his/her understanding and acceptance.     Dental advisory given  Plan Discussed with: CRNA and Anesthesiologist  Anesthesia Plan Comments:        Anesthesia Quick Evaluation

## 2020-12-28 ENCOUNTER — Encounter (HOSPITAL_COMMUNITY)
Admission: RE | Disposition: A | Discharge status: Home / Self Care | Payer: Self-pay | Source: Home / Self Care | Attending: Neurosurgery

## 2020-12-28 ENCOUNTER — Encounter (HOSPITAL_COMMUNITY): Payer: Self-pay | Admitting: Neurosurgery

## 2020-12-28 ENCOUNTER — Observation Stay (HOSPITAL_COMMUNITY)
Admission: RE | Admit: 2020-12-28 | Discharge: 2020-12-30 | Disposition: A | Discharge status: Home / Self Care | Payer: Medicare Other | Attending: Neurosurgery | Admitting: Neurosurgery

## 2020-12-28 ENCOUNTER — Ambulatory Visit (HOSPITAL_COMMUNITY): Payer: Medicare Other | Admitting: Anesthesiology

## 2020-12-28 ENCOUNTER — Other Ambulatory Visit: Payer: Self-pay

## 2020-12-28 ENCOUNTER — Ambulatory Visit (HOSPITAL_COMMUNITY): Payer: Medicare Other

## 2020-12-28 DIAGNOSIS — Z87891 Personal history of nicotine dependence: Secondary | ICD-10-CM | POA: Insufficient documentation

## 2020-12-28 DIAGNOSIS — Z79899 Other long term (current) drug therapy: Secondary | ICD-10-CM | POA: Diagnosis not present

## 2020-12-28 DIAGNOSIS — N183 Chronic kidney disease, stage 3 unspecified: Secondary | ICD-10-CM | POA: Insufficient documentation

## 2020-12-28 DIAGNOSIS — J45909 Unspecified asthma, uncomplicated: Secondary | ICD-10-CM | POA: Diagnosis not present

## 2020-12-28 DIAGNOSIS — M48062 Spinal stenosis, lumbar region with neurogenic claudication: Secondary | ICD-10-CM | POA: Diagnosis not present

## 2020-12-28 DIAGNOSIS — J441 Chronic obstructive pulmonary disease with (acute) exacerbation: Secondary | ICD-10-CM | POA: Diagnosis not present

## 2020-12-28 DIAGNOSIS — I129 Hypertensive chronic kidney disease with stage 1 through stage 4 chronic kidney disease, or unspecified chronic kidney disease: Secondary | ICD-10-CM | POA: Diagnosis not present

## 2020-12-28 DIAGNOSIS — Z419 Encounter for procedure for purposes other than remedying health state, unspecified: Secondary | ICD-10-CM

## 2020-12-28 DIAGNOSIS — Z20822 Contact with and (suspected) exposure to covid-19: Secondary | ICD-10-CM | POA: Insufficient documentation

## 2020-12-28 HISTORY — PX: LUMBAR LAMINECTOMY/DECOMPRESSION MICRODISCECTOMY: SHX5026

## 2020-12-28 LAB — BASIC METABOLIC PANEL
Anion gap: 11 (ref 5–15)
BUN: 13 mg/dL (ref 8–23)
CO2: 28 mmol/L (ref 22–32)
Calcium: 9.3 mg/dL (ref 8.9–10.3)
Chloride: 101 mmol/L (ref 98–111)
Creatinine, Ser: 0.81 mg/dL (ref 0.44–1.00)
GFR, Estimated: >60 mL/min (ref >60)
Glucose, Bld: 105 mg/dL — ABNORMAL HIGH (ref 70–99)
Potassium: 4 mmol/L (ref 3.5–5.1)
Sodium: 140 mmol/L (ref 135–145)

## 2020-12-28 LAB — CBC WITH DIFFERENTIAL/PLATELET
Abs Immature Granulocytes: 0.04 10*3/uL (ref 0.00–0.07)
Basophils Absolute: 0 10*3/uL (ref 0.0–0.1)
Basophils Relative: 0 %
Eosinophils Absolute: 0.1 10*3/uL (ref 0.0–0.5)
Eosinophils Relative: 1 %
HCT: 39.8 % (ref 36.0–46.0)
Hemoglobin: 12.6 g/dL (ref 12.0–15.0)
Immature Granulocytes: 0 %
Lymphocytes Relative: 13 %
Lymphs Abs: 1.2 10*3/uL (ref 0.7–4.0)
MCH: 29.4 pg (ref 26.0–34.0)
MCHC: 31.7 g/dL (ref 30.0–36.0)
MCV: 93 fL (ref 80.0–100.0)
Monocytes Absolute: 0.8 10*3/uL (ref 0.1–1.0)
Monocytes Relative: 8 %
Neutro Abs: 7.6 10*3/uL (ref 1.7–7.7)
Neutrophils Relative %: 78 %
Platelets: 301 10*3/uL (ref 150–400)
RBC: 4.28 MIL/uL (ref 3.87–5.11)
RDW: 16.7 % — ABNORMAL HIGH (ref 11.5–15.5)
WBC: 9.7 10*3/uL (ref 4.0–10.5)
nRBC: 0 % (ref 0.0–0.2)

## 2020-12-28 LAB — SURGICAL PCR SCREEN
MRSA, PCR: NEGATIVE
Staphylococcus aureus: NEGATIVE

## 2020-12-28 LAB — SARS CORONAVIRUS 2 BY RT PCR (HOSPITAL ORDER, PERFORMED IN ~~LOC~~ HOSPITAL LAB): SARS Coronavirus 2: NEGATIVE

## 2020-12-28 IMAGING — CR DG LUMBAR SPINE 1V
1 series · 1 of 1 positions shown · non-contrast
Comparison: MRI [DATE]

CLINICAL DATA: L2-3 laminectomy

EXAM:
LUMBAR SPINE - 1 VIEW

[lateral]
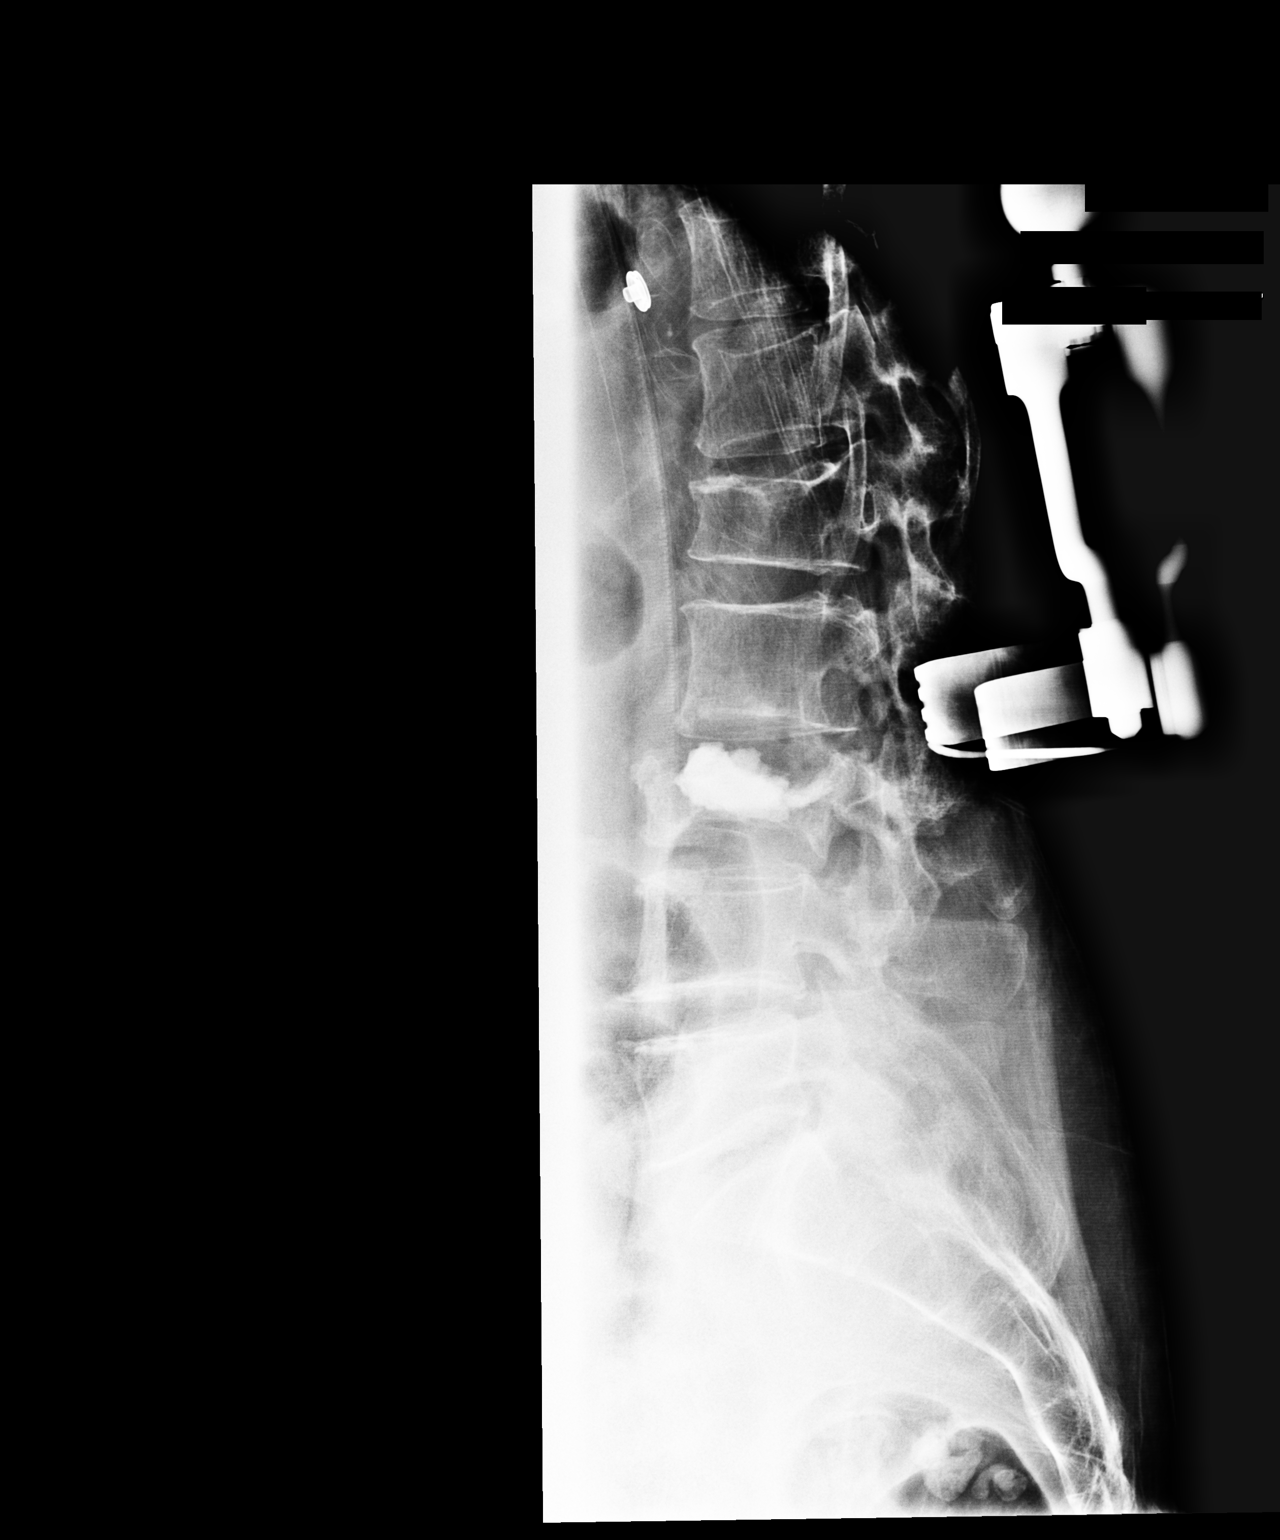

[1 of 1 positions shown; findings below may reference images not displayed]

FINDINGS: Single cross-table lateral view of the lumbar spine demonstrates
vertebroplasty changes at L3. Posterior surgical instruments are
directed at L2-3.
IMPRESSION: Intraoperative localization as above.

## 2020-12-28 SURGERY — LUMBAR LAMINECTOMY/DECOMPRESSION MICRODISCECTOMY 1 LEVEL
Anesthesia: General | Site: Back

## 2020-12-28 MED ORDER — HYDROMORPHONE HCL 1 MG/ML IJ SOLN
0.2500 mg | INTRAMUSCULAR | Status: DC | PRN
  Administered 2020-12-28 (×2): 0.5 mg via INTRAVENOUS

## 2020-12-28 MED ORDER — KETOROLAC TROMETHAMINE 30 MG/ML IJ SOLN
INTRAMUSCULAR | Status: DC | PRN
  Administered 2020-12-28: 15 mg via INTRAVENOUS

## 2020-12-28 MED ORDER — THROMBIN FOR PERCUTANEOUS TREATMENT OF PSEUDOANEURYSM (5000UNITS/10ML)
PERCUTANEOUS | Status: AC
  Filled 2020-12-28: qty 2

## 2020-12-28 MED ORDER — ONDANSETRON HCL 4 MG/2ML IJ SOLN: 4.0000 mg | Freq: Once | INTRAMUSCULAR | Status: DC | PRN

## 2020-12-28 MED ORDER — ONDANSETRON HCL 4 MG/2ML IJ SOLN
INTRAMUSCULAR | Status: DC | PRN
  Administered 2020-12-28: 4 mg via INTRAVENOUS

## 2020-12-28 MED ORDER — SODIUM CHLORIDE 0.9% FLUSH: 3.0000 mL | INTRAVENOUS | Status: DC | PRN

## 2020-12-28 MED ORDER — PROPOFOL 500 MG/50ML IV EMUL
INTRAVENOUS | Status: DC | PRN
  Administered 2020-12-28: 25 ug/kg/min via INTRAVENOUS

## 2020-12-28 MED ORDER — FENTANYL CITRATE (PF) 250 MCG/5ML IJ SOLN
INTRAMUSCULAR | Status: AC
  Filled 2020-12-28: qty 5

## 2020-12-28 MED ORDER — CHLORHEXIDINE GLUCONATE CLOTH 2 % EX PADS: 6.0000 | MEDICATED_PAD | Freq: Once | CUTANEOUS | Status: DC

## 2020-12-28 MED ORDER — SODIUM CHLORIDE 0.9% FLUSH
3.0000 mL | Freq: Two times a day (BID) | INTRAVENOUS | Status: DC
  Administered 2020-12-28: 3 mL via INTRAVENOUS

## 2020-12-28 MED ORDER — DICYCLOMINE HCL 10 MG PO CAPS: 10.0000 mg | ORAL_CAPSULE | Freq: Two times a day (BID) | ORAL | Status: DC

## 2020-12-28 MED ORDER — ESMOLOL HCL 100 MG/10ML IV SOLN
INTRAVENOUS | Status: DC | PRN
  Administered 2020-12-28: 100 mg via INTRAVENOUS

## 2020-12-28 MED ORDER — LIDOCAINE 2% (20 MG/ML) 5 ML SYRINGE
INTRAMUSCULAR | Status: DC | PRN
  Administered 2020-12-28: 40 mg via INTRAVENOUS

## 2020-12-28 MED ORDER — MOMETASONE FURO-FORMOTEROL FUM 200-5 MCG/ACT IN AERO
2.0000 | INHALATION_SPRAY | Freq: Two times a day (BID) | RESPIRATORY_TRACT | Status: DC
  Administered 2020-12-28 – 2020-12-29 (×2): 2 via RESPIRATORY_TRACT
  Filled 2020-12-28: qty 8.8

## 2020-12-28 MED ORDER — PANTOPRAZOLE SODIUM 40 MG PO TBEC
40.0000 mg | DELAYED_RELEASE_TABLET | Freq: Every day | ORAL | Status: DC
  Administered 2020-12-28 – 2020-12-30 (×3): 40 mg via ORAL
  Filled 2020-12-28 (×3): qty 1

## 2020-12-28 MED ORDER — CEFAZOLIN SODIUM-DEXTROSE 1-4 GM/50ML-% IV SOLN
1.0000 g | Freq: Three times a day (TID) | INTRAVENOUS | Status: AC
  Administered 2020-12-28 (×2): 1 g via INTRAVENOUS
  Filled 2020-12-28 (×2): qty 50

## 2020-12-28 MED ORDER — HYDROMORPHONE HCL 1 MG/ML IJ SOLN: 0.5000 mg | INTRAMUSCULAR | Status: DC | PRN

## 2020-12-28 MED ORDER — ACETAMINOPHEN 325 MG PO TABS
ORAL_TABLET | ORAL | Status: AC
  Filled 2020-12-28: qty 1

## 2020-12-28 MED ORDER — ONDANSETRON HCL 4 MG/2ML IJ SOLN
INTRAMUSCULAR | Status: AC
  Filled 2020-12-28: qty 2

## 2020-12-28 MED ORDER — ROCURONIUM BROMIDE 10 MG/ML (PF) SYRINGE
PREFILLED_SYRINGE | INTRAVENOUS | Status: AC
  Filled 2020-12-28: qty 10

## 2020-12-28 MED ORDER — MONTELUKAST SODIUM 10 MG PO TABS
10.0000 mg | ORAL_TABLET | Freq: Every evening | ORAL | Status: DC
  Administered 2020-12-28 – 2020-12-29 (×2): 10 mg via ORAL
  Filled 2020-12-28 (×2): qty 1

## 2020-12-28 MED ORDER — LOSARTAN POTASSIUM 50 MG PO TABS
25.0000 mg | ORAL_TABLET | Freq: Every day | ORAL | Status: DC
  Administered 2020-12-28 – 2020-12-30 (×3): 25 mg via ORAL
  Filled 2020-12-28 (×3): qty 1

## 2020-12-28 MED ORDER — BUPIVACAINE HCL (PF) 0.25 % IJ SOLN
INTRAMUSCULAR | Status: DC | PRN
  Administered 2020-12-28: 20 mL

## 2020-12-28 MED ORDER — CYCLOBENZAPRINE HCL 10 MG PO TABS
10.0000 mg | ORAL_TABLET | Freq: Three times a day (TID) | ORAL | Status: DC | PRN
  Administered 2020-12-29 – 2020-12-30 (×3): 10 mg via ORAL
  Filled 2020-12-28 (×3): qty 1

## 2020-12-28 MED ORDER — SUGAMMADEX SODIUM 200 MG/2ML IV SOLN
INTRAVENOUS | Status: DC | PRN
  Administered 2020-12-28: 140 mg via INTRAVENOUS

## 2020-12-28 MED ORDER — ROCURONIUM BROMIDE 10 MG/ML (PF) SYRINGE
PREFILLED_SYRINGE | INTRAVENOUS | Status: DC | PRN
  Administered 2020-12-28: 70 mg via INTRAVENOUS

## 2020-12-28 MED ORDER — HYDROMORPHONE HCL 1 MG/ML IJ SOLN
INTRAMUSCULAR | Status: AC
  Filled 2020-12-28: qty 1

## 2020-12-28 MED ORDER — ACETAMINOPHEN 500 MG PO TABS
ORAL_TABLET | ORAL | Status: AC
  Administered 2020-12-28: 825 mg via ORAL
  Filled 2020-12-28: qty 2

## 2020-12-28 MED ORDER — CHLORHEXIDINE GLUCONATE 0.12 % MT SOLN
OROMUCOSAL | Status: AC
  Administered 2020-12-28: 15 mL
  Filled 2020-12-28: qty 15

## 2020-12-28 MED ORDER — SODIUM CHLORIDE 0.9 % IV SOLN
250.0000 mL | INTRAVENOUS | Status: DC
  Administered 2020-12-28: 250 mL via INTRAVENOUS

## 2020-12-28 MED ORDER — 0.9 % SODIUM CHLORIDE (POUR BTL) OPTIME
TOPICAL | Status: DC | PRN
  Administered 2020-12-28: 1000 mL

## 2020-12-28 MED ORDER — DEXAMETHASONE SODIUM PHOSPHATE 10 MG/ML IJ SOLN
INTRAMUSCULAR | Status: AC
  Filled 2020-12-28: qty 1

## 2020-12-28 MED ORDER — HYDROCODONE-ACETAMINOPHEN 5-325 MG PO TABS: 1.0000 | ORAL_TABLET | ORAL | Status: DC | PRN

## 2020-12-28 MED ORDER — HYDROMORPHONE HCL 1 MG/ML IJ SOLN: 1.0000 mg | INTRAMUSCULAR | Status: DC | PRN

## 2020-12-28 MED ORDER — KETOROLAC TROMETHAMINE 15 MG/ML IJ SOLN
15.0000 mg | Freq: Four times a day (QID) | INTRAMUSCULAR | Status: AC
  Administered 2020-12-28 – 2020-12-29 (×4): 15 mg via INTRAVENOUS
  Filled 2020-12-28 (×4): qty 1

## 2020-12-28 MED ORDER — PROPOFOL 10 MG/ML IV BOLUS
INTRAVENOUS | Status: DC | PRN
  Administered 2020-12-28: 120 mg via INTRAVENOUS

## 2020-12-28 MED ORDER — ACETAMINOPHEN 325 MG PO TABS: 650.0000 mg | ORAL_TABLET | ORAL | Status: DC | PRN

## 2020-12-28 MED ORDER — MENTHOL 3 MG MT LOZG: 1.0000 | LOZENGE | OROMUCOSAL | Status: DC | PRN

## 2020-12-28 MED ORDER — MIDAZOLAM HCL 2 MG/2ML IJ SOLN
INTRAMUSCULAR | Status: AC
  Filled 2020-12-28: qty 2

## 2020-12-28 MED ORDER — HYDROMORPHONE HCL 1 MG/ML IJ SOLN
INTRAMUSCULAR | Status: DC | PRN
  Administered 2020-12-28: .5 mg via INTRAVENOUS

## 2020-12-28 MED ORDER — OXYCODONE HCL 5 MG PO TABS
ORAL_TABLET | ORAL | Status: AC
  Filled 2020-12-28: qty 1

## 2020-12-28 MED ORDER — OXYCODONE HCL 5 MG PO TABS
5.0000 mg | ORAL_TABLET | Freq: Once | ORAL | Status: AC | PRN
Start: 2020-12-28 — End: 2020-12-28
  Administered 2020-12-28: 5 mg via ORAL

## 2020-12-28 MED ORDER — FAMOTIDINE 20 MG PO TABS: 40.0000 mg | ORAL_TABLET | Freq: Two times a day (BID) | ORAL | Status: DC

## 2020-12-28 MED ORDER — ONDANSETRON 4 MG PO TBDP: 4.0000 mg | ORAL_TABLET | Freq: Three times a day (TID) | ORAL | Status: DC | PRN

## 2020-12-28 MED ORDER — OXYCODONE HCL 5 MG/5ML PO SOLN: 5.0000 mg | Freq: Once | ORAL | Status: AC | PRN

## 2020-12-28 MED ORDER — THROMBIN 5000 UNITS EX SOLR
CUTANEOUS | Status: DC | PRN
  Administered 2020-12-28 (×2): 5000 [IU] via TOPICAL

## 2020-12-28 MED ORDER — LIDOCAINE 2% (20 MG/ML) 5 ML SYRINGE
INTRAMUSCULAR | Status: AC
  Filled 2020-12-28: qty 5

## 2020-12-28 MED ORDER — FENTANYL CITRATE (PF) 100 MCG/2ML IJ SOLN
INTRAMUSCULAR | Status: DC | PRN
  Administered 2020-12-28: 150 ug via INTRAVENOUS

## 2020-12-28 MED ORDER — PHENOL 1.4 % MT LIQD: 1.0000 | OROMUCOSAL | Status: DC | PRN

## 2020-12-28 MED ORDER — CEFAZOLIN SODIUM-DEXTROSE 2-4 GM/100ML-% IV SOLN
2.0000 g | INTRAVENOUS | Status: AC
  Administered 2020-12-28: 2 g via INTRAVENOUS

## 2020-12-28 MED ORDER — PROPOFOL 10 MG/ML IV BOLUS
INTRAVENOUS | Status: AC
  Filled 2020-12-28: qty 60

## 2020-12-28 MED ORDER — HEMOSTATIC AGENTS (NO CHARGE) OPTIME
TOPICAL | Status: DC | PRN
  Administered 2020-12-28: 1 via TOPICAL

## 2020-12-28 MED ORDER — LACTATED RINGERS IV SOLN: INTRAVENOUS | Status: DC | PRN

## 2020-12-28 MED ORDER — DEXAMETHASONE SODIUM PHOSPHATE 10 MG/ML IJ SOLN
10.0000 mg | Freq: Once | INTRAMUSCULAR | Status: AC
  Administered 2020-12-28: 10 mg via INTRAVENOUS

## 2020-12-28 MED ORDER — PHENYLEPHRINE HCL-NACL 10-0.9 MG/250ML-% IV SOLN
INTRAVENOUS | Status: DC | PRN
  Administered 2020-12-28: 25 ug/min via INTRAVENOUS

## 2020-12-28 MED ORDER — HYDROCODONE-ACETAMINOPHEN 10-325 MG PO TABS
1.0000 | ORAL_TABLET | ORAL | Status: DC | PRN
  Administered 2020-12-28 (×3): 2 via ORAL
  Administered 2020-12-29 (×2): 1 via ORAL
  Administered 2020-12-29: 2 via ORAL
  Administered 2020-12-29 (×2): 1 via ORAL
  Administered 2020-12-30 (×2): 2 via ORAL
  Filled 2020-12-28 (×2): qty 1
  Filled 2020-12-28: qty 2
  Filled 2020-12-28 (×2): qty 1
  Filled 2020-12-28 (×4): qty 2
  Filled 2020-12-28: qty 1
  Filled 2020-12-28: qty 2

## 2020-12-28 MED ORDER — ACETAMINOPHEN 650 MG RE SUPP: 650.0000 mg | RECTAL | Status: DC | PRN

## 2020-12-28 MED ORDER — ALBUTEROL SULFATE HFA 108 (90 BASE) MCG/ACT IN AERS
1.0000 | INHALATION_SPRAY | RESPIRATORY_TRACT | Status: DC | PRN
  Filled 2020-12-28: qty 6.7

## 2020-12-28 MED ORDER — PHENYLEPHRINE 40 MCG/ML (10ML) SYRINGE FOR IV PUSH (FOR BLOOD PRESSURE SUPPORT)
PREFILLED_SYRINGE | INTRAVENOUS | Status: DC | PRN
  Administered 2020-12-28 (×2): 120 ug via INTRAVENOUS

## 2020-12-28 MED ORDER — BUPIVACAINE HCL (PF) 0.25 % IJ SOLN
INTRAMUSCULAR | Status: AC
  Filled 2020-12-28: qty 30

## 2020-12-28 MED ORDER — ONDANSETRON HCL 4 MG PO TABS: 4.0000 mg | ORAL_TABLET | Freq: Four times a day (QID) | ORAL | Status: DC | PRN

## 2020-12-28 MED ORDER — ONDANSETRON HCL 4 MG/2ML IJ SOLN: 4.0000 mg | Freq: Four times a day (QID) | INTRAMUSCULAR | Status: DC | PRN

## 2020-12-28 MED ORDER — MIDAZOLAM HCL 2 MG/2ML IJ SOLN
INTRAMUSCULAR | Status: DC | PRN
  Administered 2020-12-28: 2 mg via INTRAVENOUS

## 2020-12-28 MED ORDER — ACETAMINOPHEN 500 MG PO TABS: 1000.0000 mg | ORAL_TABLET | Freq: Once | ORAL | Status: AC

## 2020-12-28 MED ORDER — CEFAZOLIN SODIUM-DEXTROSE 2-4 GM/100ML-% IV SOLN
INTRAVENOUS | Status: AC
  Filled 2020-12-28: qty 100

## 2020-12-28 MED ORDER — PHENYLEPHRINE 40 MCG/ML (10ML) SYRINGE FOR IV PUSH (FOR BLOOD PRESSURE SUPPORT)
PREFILLED_SYRINGE | INTRAVENOUS | Status: AC
  Filled 2020-12-28: qty 10

## 2020-12-28 MED ORDER — HYDROMORPHONE HCL 1 MG/ML IJ SOLN
INTRAMUSCULAR | Status: AC
  Filled 2020-12-28: qty 0.5

## 2020-12-28 SURGICAL SUPPLY — 50 items
ADH SKN CLS APL DERMABOND .7 (GAUZE/BANDAGES/DRESSINGS) ×1
APL SKNCLS STERI-STRIP NONHPOA (GAUZE/BANDAGES/DRESSINGS) ×1
BAG DECANTER FOR FLEXI CONT (MISCELLANEOUS) ×2 IMPLANT
BAND INSRT 18 STRL LF DISP RB (MISCELLANEOUS) ×2
BAND RUBBER #18 3X1/16 STRL (MISCELLANEOUS) ×4 IMPLANT
BENZOIN TINCTURE PRP APPL 2/3 (GAUZE/BANDAGES/DRESSINGS) ×2 IMPLANT
BLADE CLIPPER SURG (BLADE) IMPLANT
BUR CUTTER 7.0 ROUND (BURR) ×2 IMPLANT
CANISTER SUCT 3000ML PPV (MISCELLANEOUS) ×2 IMPLANT
CARTRIDGE OIL MAESTRO DRILL (MISCELLANEOUS) ×1 IMPLANT
COVER WAND RF STERILE (DRAPES) ×2 IMPLANT
DECANTER SPIKE VIAL GLASS SM (MISCELLANEOUS) ×2 IMPLANT
DERMABOND ADVANCED (GAUZE/BANDAGES/DRESSINGS) ×1
DERMABOND ADVANCED .7 DNX12 (GAUZE/BANDAGES/DRESSINGS) ×1 IMPLANT
DIFFUSER DRILL AIR PNEUMATIC (MISCELLANEOUS) ×2 IMPLANT
DRAPE HALF SHEET 40X57 (DRAPES) IMPLANT
DRAPE LAPAROTOMY 100X72X124 (DRAPES) ×2 IMPLANT
DRAPE MICROSCOPE LEICA (MISCELLANEOUS) ×2 IMPLANT
DRAPE SURG 17X23 STRL (DRAPES) ×4 IMPLANT
DRSG OPSITE POSTOP 4X6 (GAUZE/BANDAGES/DRESSINGS) ×1 IMPLANT
DURAPREP 26ML APPLICATOR (WOUND CARE) ×2 IMPLANT
ELECT REM PT RETURN 9FT ADLT (ELECTROSURGICAL) ×2
ELECTRODE REM PT RTRN 9FT ADLT (ELECTROSURGICAL) ×1 IMPLANT
GAUZE 4X4 16PLY RFD (DISPOSABLE) IMPLANT
GAUZE SPONGE 4X4 12PLY STRL (GAUZE/BANDAGES/DRESSINGS) ×2 IMPLANT
GLOVE BIO SURGEON STRL SZ 6.5 (GLOVE) ×2 IMPLANT
GLOVE ECLIPSE 9.0 STRL (GLOVE) ×2 IMPLANT
GLOVE EXAM NITRILE XL STR (GLOVE) IMPLANT
GLOVE SURG UNDER POLY LF SZ6.5 (GLOVE) ×3 IMPLANT
GOWN STRL REUS W/ TWL LRG LVL3 (GOWN DISPOSABLE) IMPLANT
GOWN STRL REUS W/ TWL XL LVL3 (GOWN DISPOSABLE) ×1 IMPLANT
GOWN STRL REUS W/TWL 2XL LVL3 (GOWN DISPOSABLE) IMPLANT
GOWN STRL REUS W/TWL LRG LVL3 (GOWN DISPOSABLE) ×4
GOWN STRL REUS W/TWL XL LVL3 (GOWN DISPOSABLE) ×2
KIT BASIN OR (CUSTOM PROCEDURE TRAY) ×2 IMPLANT
KIT TURNOVER KIT B (KITS) ×2 IMPLANT
NDL SPNL 22GX3.5 QUINCKE BK (NEEDLE) IMPLANT
NEEDLE HYPO 22GX1.5 SAFETY (NEEDLE) ×2 IMPLANT
NEEDLE SPNL 22GX3.5 QUINCKE BK (NEEDLE) IMPLANT
NS IRRIG 1000ML POUR BTL (IV SOLUTION) ×2 IMPLANT
OIL CARTRIDGE MAESTRO DRILL (MISCELLANEOUS) ×2
PACK LAMINECTOMY NEURO (CUSTOM PROCEDURE TRAY) ×2 IMPLANT
PAD ARMBOARD 7.5X6 YLW CONV (MISCELLANEOUS) ×3 IMPLANT
SPONGE SURGIFOAM ABS GEL SZ50 (HEMOSTASIS) ×2 IMPLANT
STRIP CLOSURE SKIN 1/2X4 (GAUZE/BANDAGES/DRESSINGS) ×2 IMPLANT
SUT VIC AB 2-0 CT1 18 (SUTURE) ×2 IMPLANT
SUT VIC AB 3-0 SH 8-18 (SUTURE) ×2 IMPLANT
TOWEL GREEN STERILE (TOWEL DISPOSABLE) ×2 IMPLANT
TOWEL GREEN STERILE FF (TOWEL DISPOSABLE) ×2 IMPLANT
WATER STERILE IRR 1000ML POUR (IV SOLUTION) ×2 IMPLANT

## 2020-12-28 NOTE — Progress Notes (Signed)
Orthopedic Tech Progress Note Patient Details:  PAETON LATOUCHE 10/22/1946 [id number] Called in order to [facility] for a LSO BRACE Patient ID: Susan Grimes, female   DOB: 05-Nov-1945, 75 y.o.   MRN: [id number]   Janit Pagan 12/28/2020, 7:03 PM

## 2020-12-28 NOTE — Brief Op Note (Signed)
12/28/2020  10:05 AM  PATIENT:  Ortencia Kick  75 y.o. female  PRE-OPERATIVE DIAGNOSIS:  Stenosis  POST-OPERATIVE DIAGNOSIS:  Stenosis  PROCEDURE:  Procedure(s): Laminectomy - Lumbar two-Lumbar three (N/A)  SURGEON:  Surgeon(s) and Role:    Earnie Larsson, MD - Primary  PHYSICIAN ASSISTANT:   ASSISTANTSMearl Latin   ANESTHESIA:   general  EBL:  50 mL   BLOOD ADMINISTERED:none  DRAINS: none   LOCAL MEDICATIONS USED:  MARCAINE     SPECIMEN:  No Specimen  DISPOSITION OF SPECIMEN:  N/A  COUNTS:  YES  TOURNIQUET:  * No tourniquets in log *  DICTATION: .Dragon Dictation  PLAN OF CARE: Admit for overnight observation  PATIENT DISPOSITION:  PACU - hemodynamically stable.   Delay start of Pharmacological VTE agent (>24hrs) due to surgical blood loss or risk of bleeding: yes

## 2020-12-28 NOTE — Transfer of Care (Signed)
Immediate Anesthesia Transfer of Care Note  Patient: Susan Grimes  Procedure(s) Performed: Laminectomy - Lumbar two-Lumbar three (N/A Back)  Patient Location: PACU  Anesthesia Type:General  Level of Consciousness: awake, alert  and oriented  Airway & Oxygen Therapy: Patient Spontanous Breathing and Patient connected to nasal cannula oxygen  Post-op Assessment: Report given to RN, Post -op Vital signs reviewed and stable and Patient moving all extremities  Post vital signs: Reviewed and stable  Last Vitals:  Vitals Value Taken Time  BP 109/76 12/28/20 1014  Temp    Pulse 105 12/28/20 1017  Resp 15 12/28/20 1017  SpO2 96 % 12/28/20 1017  Vitals shown include unvalidated device data.  Last Pain:  Vitals:   12/28/20 0743  TempSrc:   PainSc: 10-Worst pain ever         Complications: No complications documented.

## 2020-12-28 NOTE — Evaluation (Addendum)
Physical Therapy Evaluation Patient Details Name: Susan Grimes MRN: [id number] DOB: 1946/03/11 Today's Date: 12/28/2020   History of Present Illness  Pt is a 75 y/o female s/p L2-3 laminectomy. PMH includes asthma, COPD, HTN, CKD, and TB.  Clinical Impression  Patient is s/p above surgery resulting in the deficits listed below (see PT Problem List). Pt admitted secondary to problem above with deficits below. Pt limited secondary to fatigue and lightheadedness this session. Required min to min guard A for mobility tasks using RW. Pt reports she has no assist at home as she lives alone. Feel she may required SNF level therapies if she does not progress well. If she does progress well, will likely benefit from [facility]. Patient will benefit from skilled PT to increase their independence and safety with mobility (while adhering to their precautions) to allow discharge to the venue listed below.     Follow Up Recommendations SNF vs HHPT    Equipment Recommendations  Rolling walker with 5" wheels;3in1 (PT)    Recommendations for Other Services       Precautions / Restrictions Precautions Precautions: Back Precaution Booklet Issued: Yes (comment) Precaution Comments: Reviewed back precautions with pt. Restrictions Weight Bearing Restrictions: No      Mobility  Bed Mobility Overal bed mobility: Needs Assistance Bed Mobility: Sit to Sidelying         Sit to sidelying: Supervision General bed mobility comments: Supervision to ensure log roll technique.    Transfers Overall transfer level: Needs assistance Equipment used: Rolling walker (2 wheeled) Transfers: Sit to/from Stand Sit to Stand: Min assist         General transfer comment: Min A for lift assist and steadying. Cues for hand placement.  Ambulation/Gait Ambulation/Gait assistance: Min guard;Min assist Gait Distance (Feet): 100 Feet Assistive device: Rolling walker (2 wheeled) Gait Pattern/deviations: Step-through  pattern;Decreased stride length Gait velocity: Decreased   General Gait Details: Slow, guarded gait. Min to min guard A For steadying. Pt reporting lightheadedness which limited gait this session.  Stairs            Wheelchair Mobility    Modified Rankin (Stroke Patients Only)       Balance Overall balance assessment: Needs assistance Sitting-balance support: No upper extremity supported;Feet supported Sitting balance-Leahy Scale: Fair     Standing balance support: Bilateral upper extremity supported;During functional activity Standing balance-Leahy Scale: Poor Standing balance comment: Reliant on BUE support                             Pertinent Vitals/Pain Pain Assessment: Faces Faces Pain Scale: Hurts little more Pain Location: back Pain Descriptors / Indicators: Aching;Operative site guarding Pain Intervention(s): Limited activity within patient's tolerance;Monitored during session;Repositioned    Home Living Family/patient expects to be discharged to:: Private residence Living Arrangements: Alone Available Help at Discharge:  (reports no one) Type of Home: Other(Comment) (condo) Home Access: Stairs to enter Entrance Stairs-Rails: Right;Left Entrance Stairs-Number of Steps: 14 Home Layout: One level Home Equipment: Cane - single point      Prior Function Level of Independence: Independent with assistive device(s)         Comments: Reports staying in the bed alot because of pain. Used cane when OOB     Hand Dominance        Extremity/Trunk Assessment   Upper Extremity Assessment Upper Extremity Assessment: Defer to OT evaluation    Lower Extremity Assessment Lower Extremity Assessment: Generalized weakness  Cervical / Trunk Assessment Cervical / Trunk Assessment: Other exceptions Cervical / Trunk Exceptions: s/p lumbar surgery  Communication   Communication: No difficulties  Cognition Arousal/Alertness:  Awake/alert Behavior During Therapy: Anxious Overall Cognitive Status: No family/caregiver present to determine baseline cognitive functioning                                 General Comments: Anxious with mobility. Somewhat self limiting      General Comments General comments (skin integrity, edema, etc.): Pt reports she has no one at home and MD said she may go to SNF    Exercises     Assessment/Plan    PT Assessment Patient needs continued PT services  PT Problem List Decreased strength;Decreased activity tolerance;Decreased balance;Decreased mobility;Decreased knowledge of use of DME;Decreased knowledge of precautions       PT Treatment Interventions DME instruction;Gait training;Balance training;Therapeutic exercise;Therapeutic activities;Functional mobility training;Stair training;Patient/family education    PT Goals (Current goals can be found in the Care Plan section)  Acute Rehab PT Goals Patient Stated Goal: to go to rehab PT Goal Formulation: With patient Time For Goal Achievement: 01/11/21 Potential to Achieve Goals: Fair    Frequency Min 5X/week   Barriers to discharge        Co-evaluation               AM-PAC PT "6 Clicks" Mobility  Outcome Measure Help needed turning from your back to your side while in a flat bed without using bedrails?: A Little Help needed moving from lying on your back to sitting on the side of a flat bed without using bedrails?: A Little Help needed moving to and from a bed to a chair (including a wheelchair)?: A Little Help needed standing up from a chair using your arms (e.g., wheelchair or bedside chair)?: A Little Help needed to walk in hospital room?: A Little Help needed climbing 3-5 steps with a railing? : A Lot 6 Click Score: 17    End of Session   Activity Tolerance: Treatment limited secondary to medical complications (Comment);Patient limited by pain (lightheadedness) Patient left: in bed;with call  bell/phone within reach Nurse Communication: Mobility status PT Visit Diagnosis: Unsteadiness on feet (R26.81);Muscle weakness (generalized) (M62.81)    Time: 6962-9528 PT Time Calculation (min) (ACUTE ONLY): 16 min   Charges:   PT Evaluation $PT Eval Low Complexity: 1 Low          Lou Miner, DPT  Acute Rehabilitation Services  Pager: [phone] Office: [phone]   Rudean Hitt 12/28/2020, 1:26 PM

## 2020-12-28 NOTE — Anesthesia Procedure Notes (Signed)
Procedure Name: Intubation Date/Time: 12/28/2020 8:47 AM Performed by: Leonor Liv, CRNA Pre-anesthesia Checklist: Patient identified, Emergency Drugs available, Suction available and Patient being monitored Patient Re-evaluated:Patient Re-evaluated prior to induction Oxygen Delivery Method: Circle System Utilized Preoxygenation: Pre-oxygenation with 100% oxygen Induction Type: IV induction Ventilation: Mask ventilation without difficulty Laryngoscope Size: Mac and 3 Grade View: Grade II Tube type: Oral Tube size: 7.0 mm Number of attempts: 1 Airway Equipment and Method: Stylet and Oral airway Placement Confirmation: ETT inserted through vocal cords under direct vision,  positive ETCO2 and breath sounds checked- equal and bilateral Secured at: 21 cm Tube secured with: Tape Dental Injury: Teeth and Oropharynx as per pre-operative assessment

## 2020-12-28 NOTE — H&P (Signed)
Susan Grimes is an 75 y.o. female.   Chief Complaint: Leg pain HPI: 75 year old female with severe bilateral lower extremity pain numbness and some intermittent weakness.  Patient status post L3 compression fracture with significant retropulsion and severe stenosis.  Patient has undergone kyphoplasty with good results with regard to improvement of her back pain but she continues to have lower extremity symptoms.  Patient presents now for lumbar decompressive surgery.  Past Medical History:  Diagnosis Date  . Allergic rhinitis   . Allergy   . Anemia   . Anxiety   . Asthma   . Cataract   . Chronic back pain   . Chronic pain   . Constipation   . COPD (chronic obstructive pulmonary disease) ([place])   . Depression   . Eczema   . Gallstones   . GERD (gastroesophageal reflux disease)   . Herniated intervertebral disc of lumbar spine   . Hiatal hernia   . HTN (hypertension)   . Insomnia   . Lumbar back pain   . Osteoarthritis   . Osteoporosis   . Renal disorder 06/2013   CKD stage 3  . Thyroid cyst   . Tuberculosis     Past Surgical History:  Procedure Laterality Date  . CHOLECYSTECTOMY    . COLONOSCOPY    . LUMBAR DISC SURGERY    . THYROIDECTOMY, PARTIAL    . TONSILLECTOMY    . TUBAL LIGATION      Family History  Problem Relation Age of Onset  . Emphysema Mother   . Heart failure Mother   . Stroke Father   . Heart attack Brother   . Heart attack Brother   . Heart disease Brother   . Colon cancer Neg Hx   . Esophageal cancer Neg Hx   . Rectal cancer Neg Hx   . Stomach cancer Neg Hx    Social History:  reports that she quit smoking about 29 years ago. Her smoking use included cigarettes. She quit after 30.00 years of use. She has never used smokeless tobacco. She reports current alcohol use. She reports that she does not use drugs.  Allergies:  Allergies  Allergen Reactions  . Asa [Aspirin] Other (See Comments)    Bruses, and thins her blood  . Fentanyl Nausea  And Vomiting and Other (See Comments)    Patch---headache    Medications Prior to Admission  Medication Sig Dispense Refill  . acetaminophen (TYLENOL) 500 MG tablet Take 1,000 mg by mouth every 6 (six) hours as needed for mild pain or moderate pain.    Marland Kitchen esomeprazole (NEXIUM) 20 MG capsule Take 20 mg by mouth daily at 12 noon.    Marland Kitchen losartan (COZAAR) 25 MG tablet Take 25 mg by mouth daily.    . montelukast (SINGULAIR) 10 MG tablet Take 10 mg by mouth every evening.    Marland Kitchen oxyCODONE-acetaminophen (PERCOCET/ROXICET) 5-325 MG tablet Take 1 tablet by mouth every 4 (four) hours as needed for severe pain.    Marland Kitchen albuterol (VENTOLIN HFA) 108 (90 Base) MCG/ACT inhaler Inhale 1 puff into the lungs every 4 (four) hours as needed for wheezing or shortness of breath.    . dicyclomine (BENTYL) 10 MG capsule Take 1 capsule (10 mg total) by mouth 2 (two) times daily. (Patient not taking: No sig reported) 60 capsule 0  . famotidine (PEPCID) 40 MG tablet Take 1 tablet (40 mg total) by mouth 2 (two) times daily. (Patient not taking: No sig reported) 60 tablet 0  .  Fluticasone-Salmeterol (ADVAIR) 250-50 MCG/DOSE AEPB Inhale 1 puff into the lungs daily as needed.    . meloxicam (MOBIC) 15 MG tablet Take 1 tablet (15 mg total) by mouth daily. (Patient not taking: No sig reported) 14 tablet 0  . ondansetron (ZOFRAN ODT) 4 MG disintegrating tablet Take 1 tablet (4 mg total) by mouth every 8 (eight) hours as needed for nausea or vomiting. (Patient not taking: No sig reported) 8 tablet 0    Results for orders placed or performed during the hospital encounter of 12/28/20 (from the past 48 hour(s))  SARS Coronavirus 2 by RT PCR (hospital order, performed in [facility] hospital lab) Nasopharyngeal Nasopharyngeal Swab     Status: None   Collection Time: 12/28/20  6:57 AM   Specimen: Nasopharyngeal Swab  Result Value Ref Range   SARS Coronavirus 2 NEGATIVE NEGATIVE    Comment: (NOTE) SARS-CoV-2 target nucleic acids are NOT  DETECTED.  The SARS-CoV-2 RNA is generally detectable in upper and lower respiratory specimens during the acute phase of infection. The lowest concentration of SARS-CoV-2 viral copies this assay can detect is 250 copies / mL. A negative result does not preclude SARS-CoV-2 infection and should not be used as the sole basis for treatment or other patient management decisions.  A negative result may occur with improper specimen collection / handling, submission of specimen other than nasopharyngeal swab, presence of viral mutation(s) within the areas targeted by this assay, and inadequate number of viral copies (<250 copies / mL). A negative result must be combined with clinical observations, patient history, and epidemiological information.  Fact Sheet for Patients:   StrictlyIdeas.no  Fact Sheet for Healthcare Providers: BankingDealers.co.za  This test is not yet approved or  cleared by the [place] FDA and has been authorized for detection and/or diagnosis of SARS-CoV-2 by FDA under an Emergency Use Authorization (EUA).  This EUA will remain in effect (meaning this test can be used) for the duration of the COVID-19 declaration under Section 564(b)(1) of the Act, 21 U.S.C. section 360bbb-3(b)(1), unless the authorization is terminated or revoked sooner.  Performed at [facility], [place] [address] [place] [employer], [place] [address]    No results found.  Pertinent items noted in HPI and remainder of comprehensive ROS otherwise negative.  Blood pressure (!) 168/82, pulse (!) 116, resp. rate (!) 22, height 5\' 5"  (1.651 m), weight 68 kg, SpO2 96 %.  Patient is awake and alert.  She is oriented and appropriate.  Speech is fluent.  Judgment insight are intact.  Cranial nerve function normal bilateral.  Motor examination extremities reveals intact motor strength bilateral.  Sensory examination is mildly decreased sensation pinprick and  light touch in her left L3 dermatome.  Otherwise deep sensory exam is intact.  Reflexes are normal active scepter Achilles reflexes are absent bilaterally.  Gait is antalgic.  Posture is mildly flexed peer examination head ears eyes nose and throat is unremarkable her chest and abdomen are benign.  Extremities are free from injury or deformity. Assessment/Plan L2-3 stenosis with radiculopathy.  Plan L2-3 decompressive laminectomy with foraminotomies.  Risks and benefits been explained.  Patient wishes to proceed.  Mallie Mussel A Louis Gaw 12/28/2020, 8:25 AM

## 2020-12-28 NOTE — Op Note (Signed)
Date of procedure: 12/28/2020  Date of dictation: Same  Service: Neurosurgery  Preoperative diagnosis: L2-L3 stenosis status post L3 compression fracture  Postoperative diagnosis: Same  Procedure Name: L2 and L3 decompressive laminectomies with foraminotomies  Surgeon:Reshma Hoey A.Solaris Kram, M.D.  Asst. Surgeon: Reinaldo Meeker, NP  Anesthesia: General  Indication: Patient is a 75 year old female who is status post L3 compression fracture secondary to osteoporosis.  Patient is status post kyphoplasty with good improvement of her back pain but continues to have severe radicular symptoms.  Patient with known severe stenosis at L2-3 secondary to collapse and kyphosis from her prior compression fracture.  Patient has failed conservative management and presents now for decompressive surgery.  Operative note: After induction anesthesia, patient position prone onto a Wilson frame and properly padded.  Patient's lumbar region prepped and draped sterilely.  Incision made overlying L2-3.  Dissection performed bilaterally.  Retractor placed.  X-ray taken.  Levels confirmed.  Decompressive laminectomies were then performed using [facility] a high-speed drill to remove the entire lamina of L2 and the superior half of the lamina of L3.  Ligament flavum elevated and resected.  Foraminotomies completed on the course exiting L2 and L3 nerve roots bilaterally.  At this point a very thorough decompression had been achieved.  There was no evidence of injury to the thecal sac or nerve roots.  There was no evidence of any gross instability.  I did examine the disc space and mildly retropulsed bone on the left at L2-3 and did not find anything loose or obviously since severely compressive remaining.  Wound was then irrigated.  Gelfoam was placed topically for hemostasis.  Wound is then closed in layers with Vicryl sutures.  Steri-Strips and sterile dressing were applied.  No apparent complications.  Patient tolerated  the procedure well and she returned to recovery room postop.

## 2020-12-28 NOTE — Anesthesia Postprocedure Evaluation (Signed)
Anesthesia Post Note  Patient: SUESAN MOHRMANN  Procedure(s) Performed: Laminectomy - Lumbar two-Lumbar three (N/A Back)     Patient location during evaluation: PACU Anesthesia Type: General Level of consciousness: awake and alert Pain management: pain level controlled Vital Signs Assessment: post-procedure vital signs reviewed and stable Respiratory status: spontaneous breathing, nonlabored ventilation and respiratory function stable Cardiovascular status: blood pressure returned to baseline and stable Postop Assessment: no apparent nausea or vomiting Anesthetic complications: no   No complications documented.  Last Vitals:  Vitals:   12/28/20 1100 12/28/20 1134  BP: (!) 148/86 (!) 149/83  Pulse: 96 (!) 104  Resp: 12 18  Temp: 36.6 C 36.9 C  SpO2: 99% 98%    Last Pain:  Vitals:   12/28/20 1134  TempSrc: Oral  PainSc:                  Audry Pili

## 2020-12-28 NOTE — Progress Notes (Signed)
Orthopedic Tech Progress Note Patient Details:  Susan Grimes [birth year] [id number]  Patient ID: Susan Grimes, female   DOB: 1946/04/23, 75 y.o.   MRN: [id number] Delivered brace to patient  Karolee Stamps 12/28/2020, 11:35 PM

## 2020-12-29 ENCOUNTER — Encounter (HOSPITAL_COMMUNITY): Payer: Self-pay | Admitting: Neurosurgery

## 2020-12-29 DIAGNOSIS — M48062 Spinal stenosis, lumbar region with neurogenic claudication: Secondary | ICD-10-CM | POA: Diagnosis not present

## 2020-12-29 MED ORDER — SENNOSIDES-DOCUSATE SODIUM 8.6-50 MG PO TABS
1.0000 | ORAL_TABLET | Freq: Two times a day (BID) | ORAL | Status: DC
  Administered 2020-12-29 – 2020-12-30 (×2): 1 via ORAL
  Filled 2020-12-29 (×2): qty 1

## 2020-12-29 NOTE — Care Management Obs Status (Signed)
[facility] NOTIFICATION   Patient Details  Name: Susan Grimes MRN: [id number] Date of Birth: [birth year]   Medicare Observation Status Notification Given:  Yes    Joanne Chars, LCSW 12/29/2020, 10:38 AM

## 2020-12-29 NOTE — Progress Notes (Signed)
Physical Therapy Treatment Patient Details Name: Susan Grimes MRN: [id number] DOB: Feb 16, 1946 Today's Date: 12/29/2020    History of Present Illness 75 y/o female s/p L2-3 laminectomy. PMH includes asthma, COPD, HTN, CKD, and TB.    PT Comments    Pt progressing well with post-op mobility. She was able to demonstrate transfers and ambulation with gross min guard assist to min assist and RW for support. LLE continues to demonstrate mild buckling. Pt has 14 stairs to enter and will be alone after her ride assists her into her residence. Pt could benefit from another therapy session tomorrow morning for additional stair training prior to d/c. Pt was educated on precautions, brace application/wearing schedule, appropriate activity progression, and car transfer. Will continue to follow.      Follow Up Recommendations  Home health PT;Supervision for mobility/OOB     Equipment Recommendations  Rolling walker with 5" wheels;3in1 (PT)    Recommendations for Other Services       Precautions / Restrictions Precautions Precautions: Back Precaution Booklet Issued: Yes (comment) Precaution Comments: Reviewed back precautions with pt. Restrictions Weight Bearing Restrictions: No    Mobility  Bed Mobility Overal bed mobility: Needs Assistance Bed Mobility: Sit to Sidelying;Rolling;Sidelying to Sit Rolling: Supervision Sidelying to sit: Supervision     Sit to sidelying: Supervision General bed mobility comments: Supervision to ensure log roll. VC's throughout for optimal technique.    Transfers Overall transfer level: Needs assistance Equipment used: Rolling walker (2 wheeled) Transfers: Sit to/from Stand Sit to Stand: Min guard         General transfer comment: Min Guard A for safety. Increased time and effort  Ambulation/Gait Ambulation/Gait assistance: Min assist Gait Distance (Feet): 200 Feet Assistive device: Rolling walker (2 wheeled) Gait Pattern/deviations:  Step-through pattern;Decreased stride length Gait velocity: Decreased Gait velocity interpretation: <1.31 ft/sec, indicative of household ambulator General Gait Details: Slow, guarded gait. Min assist for buckling/soft L knee with weight bearing.   Stairs Stairs: Yes Stairs assistance: Min guard;Min assist Stair Management: Two rails;Step to pattern;Forwards Number of Stairs: 1 (x3 trials) General stair comments: Min guard to min assist for safety as pt negotiated stairs. Soft L knee with weight bearing to advance RLE up first   Wheelchair Mobility    Modified Rankin (Stroke Patients Only)       Balance Overall balance assessment: Needs assistance Sitting-balance support: No upper extremity supported;Feet supported Sitting balance-Leahy Scale: Good     Standing balance support: Bilateral upper extremity supported;During functional activity;No upper extremity supported Standing balance-Leahy Scale: Fair Standing balance comment: No UE support when donning pants                            Cognition Arousal/Alertness: Awake/alert Behavior During Therapy: Anxious Overall Cognitive Status: Within Functional Limits for tasks assessed                                 General Comments: Anxious with mobility. Somewhat self limiting      Exercises      General Comments        Pertinent Vitals/Pain Pain Assessment: Faces Faces Pain Scale: Hurts little more Pain Location: back Pain Descriptors / Indicators: Aching;Operative site guarding Pain Intervention(s): Limited activity within patient's tolerance;Monitored during session;Repositioned    Home Living Family/patient expects to be discharged to:: Private residence Living Arrangements: Alone Available Help at Discharge:  (reports  no one) Type of Home: Other(Comment) (condo) Home Access: Stairs to enter Entrance Stairs-Rails: Right;Left Home Layout: One level Home Equipment: Cane - single  point;Shower seat      Prior Function Level of Independence: Independent with assistive device(s)      Comments: Reports staying in the bed alot because of pain. Used cane when OOB   PT Goals (current goals can now be found in the care plan section) Acute Rehab PT Goals Patient Stated Goal: Be able to get up her stairs with EMS to help her PT Goal Formulation: With patient Time For Goal Achievement: 01/11/21 Potential to Achieve Goals: Fair Progress towards PT goals: Progressing toward goals    Frequency    Min 5X/week      PT Plan Current plan remains appropriate    Co-evaluation              AM-PAC PT "6 Clicks" Mobility   Outcome Measure  Help needed turning from your back to your side while in a flat bed without using bedrails?: A Little Help needed moving from lying on your back to sitting on the side of a flat bed without using bedrails?: A Little Help needed moving to and from a bed to a chair (including a wheelchair)?: A Little Help needed standing up from a chair using your arms (e.g., wheelchair or bedside chair)?: A Little Help needed to walk in hospital room?: A Little Help needed climbing 3-5 steps with a railing? : A Lot 6 Click Score: 17    End of Session Equipment Utilized During Treatment: Gait belt;Back brace Activity Tolerance: Patient tolerated treatment well Patient left: in bed;with call bell/phone within reach Nurse Communication: Mobility status PT Visit Diagnosis: Unsteadiness on feet (R26.81);Muscle weakness (generalized) (M62.81)     Time: 3220-2542 PT Time Calculation (min) (ACUTE ONLY): 26 min  Charges:  $Gait Training: 23-37 mins                     Rolinda Roan, PT, DPT Acute Rehabilitation Services Pager: [phone] Office: [phone]    Thelma Comp 12/29/2020, 11:47 AM

## 2020-12-29 NOTE — [place] FL2 (Signed)
[place] MEDICAID FL2 LEVEL OF CARE SCREENING TOOL     IDENTIFICATION  Patient Name: Susan Grimes Birthdate: 09-Dec-1945 Sex: female Admission Date (Current Location): 12/28/2020  [facility] and [place] Number:  Herbalist and Address:  [facility] [place], [place] [address] [place], [place] [address]      Provider Number: [id number]  Attending Physician Name and Address:  Earnie Larsson, MD  Relative Name and Phone Number:  Sanjuan Dame   [phone]    Current Level of Care: Hospital Recommended Level of Care: [place] Prior Approval Number:    Date Approved/Denied:   PASRR Number: [id number]  Discharge Plan: SNF    Current Diagnoses: Patient Active Problem List   Diagnosis Date Noted  . Lumbar stenosis with neurogenic claudication 12/28/2020  . Low back pain 07/02/2014  . Paresthesia 07/02/2014    Orientation RESPIRATION BLADDER Height & Weight     Self,Time,Situation,Place  Normal Continent Weight: 150 lb (68 kg) Height:  5\' 5"  (165.1 cm)  BEHAVIORAL SYMPTOMS/MOOD NEUROLOGICAL BOWEL NUTRITION STATUS      Continent Diet (regular diet.  See discharge summary)  AMBULATORY STATUS COMMUNICATION OF NEEDS Skin   Limited Assist Verbally Surgical wounds                       Personal Care Assistance Level of Assistance  Bathing,Feeding,Dressing Bathing Assistance: Independent Feeding assistance: Independent Dressing Assistance: Independent     Functional Limitations Info  Sight,Hearing,Speech Sight Info: Adequate Hearing Info: Adequate Speech Info: Adequate    SPECIAL CARE FACTORS FREQUENCY  PT (By licensed PT),OT (By licensed OT)     PT Frequency: 5x week OT Frequency: 5x week            Contractures Contractures Info: Not present    Additional Factors Info  Code Status,Allergies Code Status Info: full Allergies Info: Asa (Aspirin), Fentanyl           Current Medications (12/29/2020):   This is the current hospital active medication list Current Facility-Administered Medications  Medication Dose Route Frequency Provider Last Rate Last Admin  . 0.9 %  sodium chloride infusion  250 mL Intravenous Continuous Earnie Larsson, MD 10 mL/hr at 12/28/20 1208 250 mL at 12/28/20 1208  . acetaminophen (TYLENOL) tablet 650 mg  650 mg Oral Q4H PRN Earnie Larsson, MD       Or  . acetaminophen (TYLENOL) suppository 650 mg  650 mg Rectal Q4H PRN Earnie Larsson, MD      . albuterol (VENTOLIN HFA) 108 (90 Base) MCG/ACT inhaler 1 puff  1 puff Inhalation Q4H PRN Earnie Larsson, MD      . cyclobenzaprine (FLEXERIL) tablet 10 mg  10 mg Oral TID PRN Earnie Larsson, MD   10 mg at 12/29/20 1003  . HYDROcodone-acetaminophen (NORCO) 10-325 MG per tablet 1-2 tablet  1-2 tablet Oral Q4H PRN Earnie Larsson, MD   1 tablet at 12/29/20 1442  . HYDROcodone-acetaminophen (NORCO/VICODIN) 5-325 MG per tablet 1 tablet  1 tablet Oral Q4H PRN Earnie Larsson, MD      . HYDROmorphone (DILAUDID) injection 1 mg  1 mg Intravenous Q2H PRN Earnie Larsson, MD      . losartan (COZAAR) tablet 25 mg  25 mg Oral Daily Earnie Larsson, MD   25 mg at 12/29/20 1003  . menthol-cetylpyridinium (CEPACOL) lozenge 3 mg  1 lozenge Oral PRN Earnie Larsson, MD       Or  . phenol (CHLORASEPTIC) mouth  spray 1 spray  1 spray Mouth/Throat PRN Earnie Larsson, MD      . mometasone-formoterol [facility]) 200-5 MCG/ACT inhaler 2 puff  2 puff Inhalation BID Earnie Larsson, MD   2 puff at 12/28/20 1918  . montelukast (SINGULAIR) tablet 10 mg  10 mg Oral QPM Earnie Larsson, MD   10 mg at 12/28/20 1818  . ondansetron (ZOFRAN) tablet 4 mg  4 mg Oral Q6H PRN Earnie Larsson, MD       Or  . ondansetron (ZOFRAN) injection 4 mg  4 mg Intravenous Q6H PRN Earnie Larsson, MD      . pantoprazole (PROTONIX) EC tablet 40 mg  40 mg Oral Daily Earnie Larsson, MD   40 mg at 12/29/20 1002  . sodium chloride flush (NS) 0.9 % injection 3 mL  3 mL Intravenous Q12H Earnie Larsson, MD   3 mL at 12/28/20 1210  . sodium  chloride flush (NS) 0.9 % injection 3 mL  3 mL Intravenous PRN Earnie Larsson, MD         Discharge Medications: Please see discharge summary for a list of discharge medications.  Relevant Imaging Results:  Relevant Lab Results:   Additional Information SSN [id number]  Joanne Chars, LCSW

## 2020-12-29 NOTE — TOC Initial Note (Addendum)
Transition of Care [facility]) - Initial/Assessment Note    Patient Details  Name: Susan Grimes MRN: [id number] Date of Birth: [birth year]  [facility] CM/SW Contact:    Joanne Chars, LCSW Phone Number: 12/29/2020, 3:27 PM  Clinical Narrative: CSW met with pt to discuss discharge plan and provide MOON.  Pt initially states she wants to go home with [facility].  Pt reports she does not have much support and she may want to hire an aide to help with housework.  Permission given to speak with Granddaughter, but pt reports she is "not getting along with" daughter and asked CSW not to call her.  Pt is vaccinated for covid, but not boosted.  Current equipment in home: walker, shower chair.  Pt would like 3n1.  CSW left the room and returned with [facility] letter and choice document.  Pt then states that she thinks it would be better to go to SNF for a few weeks.     1410: CSW informed PT and NP that pt is asking for SNF.  NP wants to reassess in AM and decide on plan at that point.  Pt sent out in hub so that SNF process is underway in case SNF is the decision.                 [place] Barriers to Discharge: Continued Medical Work up   Patient Goals and CMS Choice Patient states their goals for this hospitalization and ongoing recovery are:: "be able to be self sufficient" CMS Medicare.gov Compare Post Acute Care list provided to:: Patient Choice offered to / list presented to : Patient  Expected Discharge Plan and Services Expected Discharge Plan: [place] In-house Referral: Clinical Social Work   Post Acute Care Choice: [place] Living arrangements for the past 2 months: Apartment                                      Prior Living Arrangements/Services Living arrangements for the past 2 months: Apartment Lives with:: Self Patient language and need for interpreter reviewed:: Yes Do you feel safe  going back to the place where you live?: Yes      Need for Family Participation in Patient Care: No (Comment) Care giver support system in place?: Yes (comment) Current home services: Other (comment) (none) Criminal Activity/Legal Involvement Pertinent to Current Situation/Hospitalization: No - Comment as needed  Activities of Daily Living Home Assistive Devices/Equipment: Eyeglasses,Cane (specify quad or straight) ADL Screening (condition at time of admission) Patient's cognitive ability adequate to safely complete daily activities?: Yes Is the patient deaf or have difficulty hearing?: No Does the patient have difficulty seeing, even when wearing glasses/contacts?: No Does the patient have difficulty concentrating, remembering, or making decisions?: No Patient able to express need for assistance with ADLs?: Yes Does the patient have difficulty dressing or bathing?: Yes Independently performs ADLs?: Yes (appropriate for developmental age) Does the patient have difficulty walking or climbing stairs?: Yes Weakness of Legs: Both Weakness of Arms/Hands: None  Permission Sought/Granted Permission sought to share information with : Family Supports Permission granted to share information with : Yes, Verbal Permission Granted  Share Information with NAME: granddaughtrer Jinny Blossom [phone]  Permission granted to share info w AGENCY: SNF/HH        Emotional Assessment Appearance:: Appears stated age Attitude/Demeanor/Rapport: Engaged Affect (typically  observed): Appropriate,Pleasant Orientation: : Oriented to Self,Oriented to Place,Oriented to  Time,Oriented to Situation Alcohol / Substance Use: Not Applicable Psych Involvement: No (comment)  Admission diagnosis:  Lumbar stenosis with neurogenic claudication [M48.062] Patient Active Problem List   Diagnosis Date Noted  . Lumbar stenosis with neurogenic claudication 12/28/2020  . Low back pain 07/02/2014  . Paresthesia 07/02/2014    PCP:  Sonia Side., FNP Pharmacy:   [facility], [address] [place] [address] Phone: [phone] Fax: [phone]     Social Determinants of Health (SDOH) Interventions    Readmission Risk Interventions No flowsheet data found.

## 2020-12-29 NOTE — Progress Notes (Signed)
   Providing Compassionate, Quality Care - Together   Subjective: Patient reports sharp left-sided rib pain with inspiration. She denies SOB or chest pain. She tells me her left leg has been buckling when she ambulates.  Objective: Vital signs in last 24 hours: Temp:  [97.8 F (36.6 C)-98.5 F (36.9 C)] 97.8 F (36.6 C) (03/03 0733) Pulse Rate:  [83-104] 89 (03/03 1038) Resp:  [12-18] 18 (03/03 0733) BP: (113-163)/(55-96) 114/55 (03/03 0733) SpO2:  [97 %-100 %] 100 % (03/03 1038)  Intake/Output from previous day: 03/02 0701 - 03/03 0700 In: 1060 [P.O.:60; I.V.:1000] Out: 200 [Urine:150; Blood:50] Intake/Output this shift: No intake/output data recorded.  Alert and oriented x 4 PERRLA CN II-XII grossly intact MAE, Left hip flexor 4/5, otherwise strength and sensation are intact Incision is covered with Honeycomb dressing and Steri Strips; Dressing is clean, dry, and intact   Lab Results: Recent Labs    12/28/20 0725  WBC 9.7  HGB 12.6  HCT 39.8  PLT 301   BMET Recent Labs    12/28/20 0725  NA 140  K 4.0  CL 101  CO2 28  GLUCOSE 105*  BUN 13  CREATININE 0.81  CALCIUM 9.3    Studies/Results: DG Lumbar Spine 1 View  Result Date: 12/28/2020 CLINICAL DATA:  L2-3 laminectomy EXAM: LUMBAR SPINE - 1 VIEW COMPARISON:  MRI 11/27/2020 FINDINGS: Single cross-table lateral view of the lumbar spine demonstrates vertebroplasty changes at L3. Posterior surgical instruments are directed at L2-3. IMPRESSION: Intraoperative localization as above. Electronically Signed   By: Rolm Baptise M.D.   On: 12/28/2020 10:38    Assessment/Plan: Patient underwent L2 and L3 decompressive laminectomies with foraminotomies by Dr. Annette Stable on 12/28/2020. Her lower extremity pain is much improved, but she is still somewhat weak in her LLE.   LOS: 0 days    -Will stay one more day to get more comfortable with going up and down stairs -Plan for discharge home with home health PT/OT  tomorrow.   Viona Gilmore, DNP, AGNP-C Nurse Practitioner  [facility] [address], [address], [place], [place] [address] P: [phone]    F: [phone]  12/29/2020, 10:40 AM

## 2020-12-29 NOTE — Progress Notes (Signed)
Physical Therapy Treatment Patient Details Name: Susan Grimes MRN: [id number] DOB: 11/19/1945 Today's Date: 12/29/2020    History of Present Illness 75 y/o female s/p L2-3 laminectomy. PMH includes asthma, COPD, HTN, CKD, and TB.    PT Comments    Pt is progressing steadily toward goals, but is not confident in herself.  Emphasis on transitions, donning brace, sit to stands, gait stability in the RW and negotiating steps.  Overall, showing occasional soft buckling of the L LE still.  L LE buckling most problematic on descent of stairs.    Follow Up Recommendations  Home health PT;Supervision for mobility/OOB;Other (comment) (pt is starting to get scared of going directly home with her limited assist.)     Equipment Recommendations  Rolling walker with 5" wheels;3in1 (PT)    Recommendations for Other Services       Precautions / Restrictions Precautions Precautions: Back Precaution Booklet Issued: Yes (comment) Precaution Comments: Reviewed back precautions with pt.    Mobility  Bed Mobility Overal bed mobility: Needs Assistance Bed Mobility: Sit to Sidelying;Rolling;Sidelying to Sit Rolling: Supervision Sidelying to sit: Supervision     Sit to sidelying: Supervision General bed mobility comments: Supervision to ensure log roll. VC's throughout for optimal technique.    Transfers Overall transfer level: Needs assistance Equipment used: Rolling walker (2 wheeled) Transfers: Sit to/from Stand Sit to Stand: Min guard         General transfer comment: Min Guard A for safety. Increased time and effort  Ambulation/Gait Ambulation/Gait assistance: Min assist Gait Distance (Feet): 200 Feet Assistive device: Rolling walker (2 wheeled) Gait Pattern/deviations: Step-through pattern;Decreased stride length Gait velocity: Decreased   General Gait Details: Slow, guarded gait. Appearance of right hip drop due to scoliosis.  Min assist for occasional soft buckling L knee  with weight bearing.   Stairs Stairs: Yes Stairs assistance: Min assist Stair Management: Step to pattern;Forwards;One rail Left Number of Stairs: 6 General stair comments: min assist for safety as pt negotiated stairs. continued weak left knee, most unstable descend with w/bearing.   Wheelchair Mobility    Modified Rankin (Stroke Patients Only)       Balance Overall balance assessment: Needs assistance Sitting-balance support: No upper extremity supported;Feet supported Sitting balance-Leahy Scale: Good     Standing balance support: Bilateral upper extremity supported;During functional activity;No upper extremity supported Standing balance-Leahy Scale: Fair                              Cognition Arousal/Alertness: Awake/alert Behavior During Therapy: Anxious Overall Cognitive Status: Within Functional Limits for tasks assessed                                        Exercises      General Comments        Pertinent Vitals/Pain Pain Assessment: Faces Faces Pain Scale: Hurts whole lot Pain Location: back Pain Descriptors / Indicators: Aching;Operative site guarding Pain Intervention(s): Monitored during session    Home Living                      Prior Function            PT Goals (current goals can now be found in the care plan section) Acute Rehab PT Goals Patient Stated Goal: Be able to get up her stairs with EMS  to help her PT Goal Formulation: With patient Time For Goal Achievement: 01/11/21 Potential to Achieve Goals: Fair Progress towards PT goals: Progressing toward goals    Frequency    Min 5X/week      PT Plan Current plan remains appropriate    Co-evaluation              AM-PAC PT "6 Clicks" Mobility   Outcome Measure  Help needed turning from your back to your side while in a flat bed without using bedrails?: A Little Help needed moving from lying on your back to sitting on the side of a  flat bed without using bedrails?: A Little Help needed moving to and from a bed to a chair (including a wheelchair)?: A Little Help needed standing up from a chair using your arms (e.g., wheelchair or bedside chair)?: A Little Help needed to walk in hospital room?: A Little Help needed climbing 3-5 steps with a railing? : A Lot 6 Click Score: 17    End of Session Equipment Utilized During Treatment: Gait belt;Back brace Activity Tolerance: Patient tolerated treatment well Patient left: in bed;with call bell/phone within reach Nurse Communication: Mobility status PT Visit Diagnosis: Unsteadiness on feet (R26.81);Muscle weakness (generalized) (M62.81)     Time: 2330-0762 PT Time Calculation (min) (ACUTE ONLY): 26 min  Charges:                        12/29/2020  Susan Grimes., PT Acute Rehabilitation Services [phone]  (pager) [phone]  (office)   Susan Grimes 12/29/2020, 6:48 PM

## 2020-12-29 NOTE — Evaluation (Signed)
Occupational Therapy Evaluation Patient Details Name: Susan Grimes MRN: [id number] DOB: 04-Jun-1946 Today's Date: 12/29/2020    History of Present Illness 75 y/o female s/p L2-3 laminectomy. PMH includes asthma, COPD, HTN, CKD, and TB.   Clinical Impression   PTA, pt was living alone and was independent with use of cane for mobility. Pt currently requiring Supervision for UB ADLs, Min A for brace management, Min Guard A for LB ADLs, and Min Guard A for functional mobility. Providing education on back precautions, grooming, brace management, bed mobility, LB ADLs, toileting, and tub transfer. Pt requiring Min A for tub transfer; would benefit from tub bench for safety due to buckling at LLE. Pt would benefit from further acute OT to facilitate safe dc. Recommend dc to home with HHOT for further OT to optimize safety, independence with ADLs, and return to PLOF.     Follow Up Recommendations  Home health OT    Equipment Recommendations  3 in 1 bedside commode;Tub/shower bench    Recommendations for Other Services PT consult     Precautions / Restrictions Precautions Precautions: Back Precaution Booklet Issued: Yes (comment) Precaution Comments: Reviewed back precautions with pt.      Mobility Bed Mobility Overal bed mobility: Needs Assistance Bed Mobility: Sit to Sidelying;Rolling;Sidelying to Sit Rolling: Supervision Sidelying to sit: Supervision     Sit to sidelying: Supervision General bed mobility comments: Supervision to ensure log roll technique. Mod cues for following techniques    Transfers Overall transfer level: Needs assistance Equipment used: Rolling walker (2 wheeled) Transfers: Sit to/from Stand Sit to Stand: Min guard         General transfer comment: Min Guard A for safety. Increased time and effort    Balance Overall balance assessment: Needs assistance Sitting-balance support: No upper extremity supported;Feet supported Sitting balance-Leahy Scale:  Good     Standing balance support: Bilateral upper extremity supported;During functional activity;No upper extremity supported Standing balance-Leahy Scale: Fair Standing balance comment: No UE support when donning pants                           ADL either performed or assessed with clinical judgement   ADL Overall ADL's : Needs assistance/impaired Eating/Feeding: Set up;Sitting   Grooming: Brushing hair;Min guard;Standing   Upper Body Bathing: Supervision/ safety;Set up;Sitting   Lower Body Bathing: Min guard;Sit to/from stand   Upper Body Dressing : Minimal assistance;Sitting;Supervision/safety Upper Body Dressing Details (indicate cue type and reason): Pt donning bra and shirt with supervision. Min A for brace management. Lower Body Dressing: Min guard;Sit to/from stand Lower Body Dressing Details (indicate cue type and reason): Pt using figure four method for donning underwear, pants, socks, and shoes. Toilet Transfer: Min guard;Ambulation;RW (simulated to recliner)     Toileting - Clothing Manipulation Details (indicate cue type and reason): Providing eduation on toilet hygiene technique     Functional mobility during ADLs: Min guard;Rolling walker General ADL Comments: Providing pt with education and handout on back precautions, bed mobility, brace management, UB ADLs, LB ADLs, toileting, and tub transfer. Pt performing at [place] level with increased time and effort.     Vision Baseline Vision/History: Wears glasses Wears Glasses: At all times Patient Visual Report: No change from baseline       Perception     Praxis      Pertinent Vitals/Pain Pain Assessment: Faces Faces Pain Scale: Hurts little more Pain Location: back Pain Descriptors / Indicators: Aching;Operative  site guarding Pain Intervention(s): Limited activity within patient's tolerance;Monitored during session;Repositioned     Hand Dominance     Extremity/Trunk Assessment  Upper Extremity Assessment Upper Extremity Assessment: Overall WFL for tasks assessed   Lower Extremity Assessment Lower Extremity Assessment: Defer to PT evaluation   Cervical / Trunk Assessment Cervical / Trunk Assessment: Other exceptions Cervical / Trunk Exceptions: s/p lumbar surgery   Communication Communication Communication: No difficulties   Cognition Arousal/Alertness: Awake/alert Behavior During Therapy: Anxious Overall Cognitive Status: No family/caregiver present to determine baseline cognitive functioning                                 General Comments: Anxious with mobility. Somewhat self limiting   General Comments       Exercises     Shoulder Instructions      Home Living Family/patient expects to be discharged to:: Private residence Living Arrangements: Alone Available Help at Discharge:  (reports no one) Type of Home: Other(Comment) (condo) Home Access: Stairs to enter Entrance Stairs-Number of Steps: 14 Entrance Stairs-Rails: Right;Left Home Layout: One level     Bathroom Shower/Tub: Teacher, early years/pre: Handicapped height     Home Equipment: Cane - single point;Shower seat          Prior Functioning/Environment Level of Independence: Independent with assistive device(s)        Comments: Reports staying in the bed alot because of pain. Used cane when OOB        OT Problem List: Decreased strength;Impaired balance (sitting and/or standing);Decreased activity tolerance;Decreased knowledge of use of DME or AE;Decreased knowledge of precautions;Pain      OT Treatment/Interventions: Therapeutic exercise;Self-care/ADL training;Energy conservation;DME and/or AE instruction;Therapeutic activities;Patient/family education    OT Goals(Current goals can be found in the care plan section) Acute Rehab OT Goals Patient Stated Goal: Walk safely OT Goal Formulation: With patient Time For Goal Achievement:  01/12/21 Potential to Achieve Goals: Good  OT Frequency: Min 2X/week   Barriers to D/C:            Co-evaluation              AM-PAC OT "6 Clicks" Daily Activity     Outcome Measure Help from another person eating meals?: None Help from another person taking care of personal grooming?: A Little Help from another person toileting, which includes using toliet, bedpan, or urinal?: A Little Help from another person bathing (including washing, rinsing, drying)?: A Little Help from another person to put on and taking off regular upper body clothing?: A Little Help from another person to put on and taking off regular lower body clothing?: A Little 6 Click Score: 19   End of Session Equipment Utilized During Treatment: Rolling walker;Back brace Nurse Communication: Mobility status  Activity Tolerance: Patient tolerated treatment well Patient left: in bed;with call bell/phone within reach  OT Visit Diagnosis: Unsteadiness on feet (R26.81);Other abnormalities of gait and mobility (R26.89);Muscle weakness (generalized) (M62.81);Pain Pain - part of body:  (Back)                Time: 0093-8182 OT Time Calculation (min): 29 min Charges:  OT General Charges $OT Visit: 1 Visit OT Evaluation $OT Eval Low Complexity: 1 Low OT Treatments $Self Care/Home Management : 8-22 mins  Susan Grimes MSOT, OTR/L Acute Rehab Pager: [phone] Office: [place] 12/29/2020, 10:00 AM

## 2020-12-30 DIAGNOSIS — M48062 Spinal stenosis, lumbar region with neurogenic claudication: Secondary | ICD-10-CM | POA: Diagnosis not present

## 2020-12-30 MED ORDER — DOCUSATE SODIUM 100 MG PO CAPS: 100.0000 mg | ORAL_CAPSULE | Freq: Two times a day (BID) | ORAL | 2 refills | Status: AC

## 2020-12-30 MED ORDER — CYCLOBENZAPRINE HCL 10 MG PO TABS: 10.0000 mg | ORAL_TABLET | Freq: Three times a day (TID) | ORAL | 0 refills | Status: AC | PRN

## 2020-12-30 MED ORDER — OXYCODONE-ACETAMINOPHEN 10-325 MG PO TABS: 1.0000 | ORAL_TABLET | ORAL | 0 refills | Status: DC | PRN

## 2020-12-30 NOTE — Progress Notes (Signed)
Occupational Therapy Treatment Patient Details Name: Susan Grimes MRN: [id number] DOB: 10-07-1946 Today's Date: 12/30/2020    History of present illness 75 y/o female s/p L2-3 laminectomy. PMH includes asthma, COPD, HTN, CKD, and TB.   OT comments  Pt making steady progress towards OT goals this session. Pt continues to present with increased back pain, decreased activity tolerance and LLE weakness. Pt able to complete household distance functional mobility with RW and min guard assist with RW. Pt using compensatory methods to maintain back precautions during standing grooming tasks, pt limited by pain this session reporting 8/10 pain. Pt would continue to benefit from skilled occupational therapy while admitted and after d/c to address the below listed limitations in order to improve overall functional mobility and facilitate independence with BADL participation. DC plan remains appropriate, will follow acutely per POC.     Follow Up Recommendations  Home health OT    Equipment Recommendations  3 in 1 bedside commode;Tub/shower bench    Recommendations for Other Services      Precautions / Restrictions Precautions Precautions: Back Precaution Booklet Issued: Yes (comment) Precaution Comments: Able to recall 3/3 precautions Required Braces or Orthoses: Spinal Brace Spinal Brace: Lumbar corset;Applied in sitting position Restrictions Weight Bearing Restrictions: No       Mobility Bed Mobility Overal bed mobility: Needs Assistance Bed Mobility: Rolling;Sidelying to Sit;Sit to Sidelying Rolling: Supervision Sidelying to sit: Supervision     Sit to sidelying: Supervision General bed mobility comments: HOB flat, supervision for safety, good recall of log roll technique    Transfers Overall transfer level: Needs assistance Equipment used: Rolling walker (2 wheeled) Transfers: Sit to/from Stand Sit to Stand: Min guard         General transfer comment: minguard for safety  f    Balance Overall balance assessment: Needs assistance Sitting-balance support: No upper extremity supported;Feet supported Sitting balance-Leahy Scale: Good Sitting balance - Comments: Able to donn brace with min A   Standing balance support: During functional activity;No upper extremity supported Standing balance-Leahy Scale: Fair Standing balance comment: able to complete ADLs at sink with no UE support with no LOB                           ADL either performed or assessed with clinical judgement   ADL Overall ADL's : Needs assistance/impaired     Grooming: Oral care;Standing;Supervision/safety;Cueing for compensatory techniques Grooming Details (indicate cue type and reason): standing at sink to complete oral care with 2 cup method                 Toilet Transfer: Min guard;Ambulation;RW;Regular Toilet;Grab bars   Toileting- Clothing Manipulation and Hygiene: Supervision/safety;Sitting/lateral lean       Functional mobility during ADLs: Min guard;Rolling walker General ADL Comments: pt continues to present with increased back pain, decreased activity tolerance and LLE weakness     Vision       Perception     Praxis      Cognition Arousal/Alertness: Awake/alert Behavior During Therapy: WFL for tasks assessed/performed;Anxious (anxious at times) Overall Cognitive Status: Within Functional Limits for tasks assessed                                        Exercises   Shoulder Instructions       General Comments      Pertinent  Vitals/ Pain       Pain Assessment: 0-10 Pain Score: 8  Pain Location: back, LLE Pain Descriptors / Indicators: Aching;Operative site guarding;Sore Pain Intervention(s): Limited activity within patient's tolerance;Monitored during session;Repositioned;Patient requesting pain meds-RN notified  Home Living                                          Prior Functioning/Environment               Frequency  Min 2X/week        Progress Toward Goals  OT Goals(current goals can now be found in the care plan section)  Progress towards OT goals: Progressing toward goals  Acute Rehab OT Goals Time For Goal Achievement: 01/12/21 Potential to Achieve Goals: Good  Plan Discharge plan remains appropriate;Frequency remains appropriate    Co-evaluation                 AM-PAC OT "6 Clicks" Daily Activity     Outcome Measure   Help from another person eating meals?: None Help from another person taking care of personal grooming?: A Little Help from another person toileting, which includes using toliet, bedpan, or urinal?: A Little Help from another person bathing (including washing, rinsing, drying)?: A Little Help from another person to put on and taking off regular upper body clothing?: A Little Help from another person to put on and taking off regular lower body clothing?: A Little 6 Click Score: 19    End of Session Equipment Utilized During Treatment: Rolling walker  OT Visit Diagnosis: Unsteadiness on feet (R26.81);Other abnormalities of gait and mobility (R26.89);Muscle weakness (generalized) (M62.81);Pain   Activity Tolerance Patient tolerated treatment well   Patient Left in bed;with call bell/phone within reach   Nurse Communication Mobility status;Patient requests pain meds        Time: 4827-0786 OT Time Calculation (min): 13 min  Charges: OT General Charges $OT Visit: 1 Visit OT Treatments $Self Care/Home Management : 8-22 mins  Harley Alto., COTA/L Acute Rehabilitation Services [place] 12/30/2020, 10:06 AM

## 2020-12-30 NOTE — Progress Notes (Signed)
Physical Therapy Treatment Patient Details Name: Susan Grimes MRN: [id number] DOB: [birth year] Today's Date: 12/30/2020    History of Present Illness 76 y/o female s/p L2-3 laminectomy. PMH includes asthma, COPD, HTN, CKD, and TB.    PT Comments    Patient progressing well towards PT goals. Continues to be anxious about returning home due to lack of support. Tolerated bed mobility and transfers with supervision-Min guard for safety. Emphasized WB through LLE during transitions to help with strengthening. Tolerated stair training with min A for balance/safety and use of rail. No left knee buckling noted today. Likely will see knee buckling when fatigued or with longer distances. Discussed this with pt and fall reduction strategies. Able to recall 3/3 back precautions. Per NP, RN and MD, will plan for dc home with HHPT and intermittent support from family.    Follow Up Recommendations  Home health PT;Supervision - Intermittent     Equipment Recommendations  Rolling walker with 5" wheels;3in1 (PT)    Recommendations for Other Services       Precautions / Restrictions Precautions Precautions: Back Precaution Booklet Issued: Yes (comment) Precaution Comments: Able to recall 3/3 precautions Required Braces or Orthoses: Spinal Brace Spinal Brace: Lumbar corset;Applied in sitting position Restrictions Weight Bearing Restrictions: No    Mobility  Bed Mobility Overal bed mobility: Needs Assistance Bed Mobility: Rolling;Sidelying to Sit Rolling: Supervision Sidelying to sit: Supervision       General bed mobility comments: HOB flat, no use of rail to simulate home. Good demo of log roll technique.    Transfers Overall transfer level: Needs assistance Equipment used: Rolling walker (2 wheeled) Transfers: Sit to/from Stand Sit to Stand: Min guard         General transfer comment: Min guard for safety, cues for equal WB through BLEs as pt favors RLE upon transitions. Stood from  EOB x6, good demo of hand placement.  Ambulation/Gait Ambulation/Gait assistance: Min guard Gait Distance (Feet): 200 Feet Assistive device: Rolling walker (2 wheeled) Gait Pattern/deviations: Step-through pattern;Decreased stride length;Drifts right/left Gait velocity: Decreased Gait velocity interpretation: <1.31 ft/sec, indicative of household ambulator General Gait Details: Slow, guarded gait. Appearance of right hip drop due to scoliosis.  no knee buckling noted today.   Stairs Stairs: Yes Stairs assistance: Min assist Stair Management: Step to pattern;Forwards;One rail Left Number of Stairs: 3 General stair comments: Cues for technique/safety, highly anxious but no knee buckling noted today upon descent.   Wheelchair Mobility    Modified Rankin (Stroke Patients Only)       Balance Overall balance assessment: Needs assistance Sitting-balance support: No upper extremity supported;Feet supported Sitting balance-Leahy Scale: Good Sitting balance - Comments: Able to donn brace with min A   Standing balance support: During functional activity Standing balance-Leahy Scale: Fair Standing balance comment: Able to stand statically wtihout UE support but needs UE support for walking.                            Cognition Arousal/Alertness: Awake/alert Behavior During Therapy: Anxious Overall Cognitive Status: Within Functional Limits for tasks assessed                                 General Comments: Anxious with mobility esp stairs.      Exercises Other Exercises Other Exercises: Sit to stand x5 for strengthening    General Comments  Pertinent Vitals/Pain Pain Assessment: 0-10 Pain Score: 7  Pain Location: back, LLE Pain Descriptors / Indicators: Aching;Operative site guarding;Sore Pain Intervention(s): Monitored during session;Patient requesting pain meds-RN notified;Limited activity within patient's tolerance    Home Living                       Prior Function            PT Goals (current goals can now be found in the care plan section) Progress towards PT goals: Progressing toward goals    Frequency    Min 5X/week      PT Plan Current plan remains appropriate    Co-evaluation              AM-PAC PT "6 Clicks" Mobility   Outcome Measure  Help needed turning from your back to your side while in a flat bed without using bedrails?: None Help needed moving from lying on your back to sitting on the side of a flat bed without using bedrails?: A Little Help needed moving to and from a bed to a chair (including a wheelchair)?: A Little Help needed standing up from a chair using your arms (e.g., wheelchair or bedside chair)?: A Little Help needed to walk in hospital room?: A Little Help needed climbing 3-5 steps with a railing? : A Little 6 Click Score: 19    End of Session Equipment Utilized During Treatment: Gait belt;Back brace Activity Tolerance: Patient tolerated treatment well Patient left: in bed;with call bell/phone within reach (sitting EOB with NP present) Nurse Communication: Mobility status PT Visit Diagnosis: Unsteadiness on feet (R26.81);Muscle weakness (generalized) (M62.81);Pain Pain - Right/Left: Left Pain - part of body: Leg (back)     Time: 3818-2993 PT Time Calculation (min) (ACUTE ONLY): 19 min  Charges:  $Gait Training: 8-22 mins                     Marisa Severin, PT, DPT Acute Rehabilitation Services Pager [phone] Office [place] 12/30/2020, 9:31 AM

## 2020-12-30 NOTE — Progress Notes (Signed)
Patient is discharged from room 3C03 at this time. Alert and in stable condition. IV site d/c'd and instructions read to patient with understanding verbalized and all questions answered. Left unit via wheelchair with all belongings at side.

## 2020-12-30 NOTE — Discharge Instructions (Addendum)

## 2020-12-30 NOTE — Discharge Summary (Signed)
Physician Discharge Summary     Providing Compassionate, Quality Care - Together   Patient ID: Susan Grimes MRN: [id number] DOB/AGE: [birth year] 77 y.o.  Admit date: 12/28/2020 Discharge date: 12/30/2020  Admission Diagnoses: Lumbar stenosis with neurogenic claudication  Discharge Diagnoses:  Active Problems:   Lumbar stenosis with neurogenic claudication   Discharged Condition: good  Hospital Course:   Consults: None  Significant Diagnostic Studies: radiology: DG Lumbar Spine 1 View  Result Date: 12/28/2020 CLINICAL DATA:  L2-3 laminectomy EXAM: LUMBAR SPINE - 1 VIEW COMPARISON:  MRI 11/27/2020 FINDINGS: Single cross-table lateral view of the lumbar spine demonstrates vertebroplasty changes at L3. Posterior surgical instruments are directed at L2-3. IMPRESSION: Intraoperative localization as above. Electronically Signed   By: Rolm Baptise M.D.   On: 12/28/2020 10:38     Treatments: surgery:  L2 and L3 decompressive laminectomies with foraminotomies  Discharge Exam: Blood pressure 122/81, pulse (!) 107, temperature 98.3 F (36.8 C), temperature source Oral, resp. rate 18, height 5\' 5"  (1.651 m), weight 68 kg, SpO2 98 %.   Alert and oriented x 4 PERRLA CN II-XII grossly intact MAE, Left hip flexor 4+/5, otherwise strength and sensation are intact Incision is covered with Honeycomb dressing and Steri Strips; Dressing is clean, dry, and intact  Disposition: Discharge disposition: 01-Home or Self Care       Discharge Instructions    Face-to-face encounter (required for Medicare/Medicaid patients)   Complete by: As directed    I Patricia Nettle certify that this patient is under my care and that I, or a nurse practitioner or physician's assistant working with me, had a face-to-face encounter that meets the physician face-to-face encounter requirements with this patient on 12/30/2020. The encounter with the patient was in whole, or in part for the following medical  condition(s) which is the primary reason for home health care (List medical condition): Spinal stenosis   The encounter with the patient was in whole, or in part, for the following medical condition, which is the primary reason for home health care: Spinal stenosis   I certify that, based on my findings, the following services are medically necessary home health services: Physical therapy   Reason for Medically Necessary Home Health Services: Therapy- Therapeutic Exercises to Increase Strength and Endurance   My clinical findings support the need for the above services: Unable to leave home safely without assistance and/or assistive device   Further, I certify that my clinical findings support that this patient is homebound due to: Unable to leave home safely without assistance   Home Health   Complete by: As directed    To provide the following care/treatments:  PT OT       Allergies as of 12/30/2020      Reactions   Asa [aspirin] Other (See Comments)   Bruses, and thins her blood   Fentanyl Nausea And Vomiting, Other (See Comments)   Patch---headache      Medication List    STOP taking these medications   oxyCODONE-acetaminophen 5-325 MG tablet Commonly known as: PERCOCET/ROXICET Replaced by: oxyCODONE-acetaminophen 10-325 MG tablet     TAKE these medications   acetaminophen 500 MG tablet Commonly known as: TYLENOL Take 1,000 mg by mouth every 6 (six) hours as needed for mild pain or moderate pain.   albuterol 108 (90 Base) MCG/ACT inhaler Commonly known as: VENTOLIN HFA Inhale 1 puff into the lungs every 4 (four) hours as needed for wheezing or shortness of breath.   cyclobenzaprine 10 MG tablet  Commonly known as: FLEXERIL Take 1 tablet (10 mg total) by mouth 3 (three) times daily as needed for muscle spasms.   docusate sodium 100 MG capsule Commonly known as: Colace Take 1 capsule (100 mg total) by mouth 2 (two) times daily.   esomeprazole 20 MG capsule Commonly known  as: NEXIUM Take 20 mg by mouth daily at 12 noon.   Fluticasone-Salmeterol 250-50 MCG/DOSE Aepb Commonly known as: ADVAIR Inhale 1 puff into the lungs daily as needed.   losartan 25 MG tablet Commonly known as: COZAAR Take 25 mg by mouth daily.   montelukast 10 MG tablet Commonly known as: SINGULAIR Take 10 mg by mouth every evening.   oxyCODONE-acetaminophen 10-325 MG tablet Commonly known as: Percocet Take 1 tablet by mouth every 4 (four) hours as needed for pain. Replaces: oxyCODONE-acetaminophen 5-325 MG tablet       Follow-up Information    Earnie Larsson, MD. Go on 01/03/2021.   Specialty: Neurosurgery Why: Appointment is at 3 pm. Contact information: [address] [place] [address] [phone]               Signed: Viona Gilmore, DNP, AGNP-C Nurse Practitioner  [facility] [place] [address], [place], [place] [address] P: [phone]     F: [phone]  12/30/2020, 9:02 AM

## 2020-12-30 NOTE — TOC Transition Note (Signed)
Transition of Care [facility]) - CM/SW Discharge Note   Patient Details  Name: Susan Grimes MRN: [id number] Date of Birth: [birth year]  Transition of [facility]) CM/SW Contact:  Joanne Chars, LCSW Phone Number: 12/30/2020, 9:45 AM   Clinical Narrative:   CSW informed by Viona Gilmore NP that she has met with pt and the plan is HH.  [place] arranged with [facility].  3n1 ordered with [employer].    Final next level of care: [place] Barriers to Discharge: Barriers Resolved   Patient Goals and CMS Choice Patient states their goals for this hospitalization and ongoing recovery are:: "be able to be self sufficient" CMS Medicare.gov Compare Post Acute Care list provided to:: Patient Choice offered to / list presented to : Patient  Discharge Placement                       Discharge Plan and Services In-house Referral: Clinical Social Work   Post Acute Care Choice: [place]          DME Arranged: 3-N-1 DME Agency: [employer] Date DME Agency Contacted: 12/30/20 Time DME Agency Contacted: [phone] Representative spoke with at DME Agency: [place] Arranged: Nurse's Aide,OT,PT [place] Agency: [place] Date [place]: 12/30/20 Time [place]: 3295 Representative spoke with at [place]: [place] ([place]) Interventions     Readmission Risk Interventions No flowsheet data found.

## 2021-02-17 ENCOUNTER — Other Ambulatory Visit: Payer: Self-pay | Admitting: Neurosurgery

## 2021-02-17 DIAGNOSIS — M48062 Spinal stenosis, lumbar region with neurogenic claudication: Secondary | ICD-10-CM

## 2021-03-06 ENCOUNTER — Other Ambulatory Visit: Payer: Medicare Other

## 2021-03-17 ENCOUNTER — Ambulatory Visit
Admission: RE | Admit: 2021-03-17 | Discharge: 2021-03-17 | Disposition: A | Discharge status: Home / Self Care | Payer: Medicare Other | Source: Ambulatory Visit | Attending: Neurosurgery | Admitting: Neurosurgery

## 2021-03-17 ENCOUNTER — Other Ambulatory Visit: Payer: Self-pay

## 2021-03-17 DIAGNOSIS — M48062 Spinal stenosis, lumbar region with neurogenic claudication: Secondary | ICD-10-CM

## 2021-03-17 IMAGING — MR MR LUMBAR SPINE WO/W CM
4 of 7 series · 24 of 48 positions shown · IV contrast (multihance)
Comparison: MRI of the lumbar spine [DATE]

CLINICAL DATA: Low back pain.

EXAM:
MRI LUMBAR SPINE WITHOUT AND WITH CONTRAST
TECHNIQUE: Multiplanar and multiecho pulse sequences of the lumbar spine were
obtained without and with intravenous contrast.
CONTRAST:  10mL MULTIHANCE GADOBENATE DIMEGLUMINE 529 MG/ML IV SOLN

[Series 5: T1 · sagittal · 4.0mm · 1.09mm/px · 5 of 17 slices shown (1 of 2)]
[im 1/17]
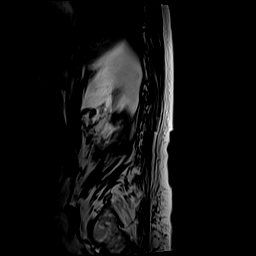
[im 5/17]
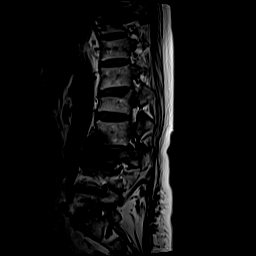
[im 9/17]
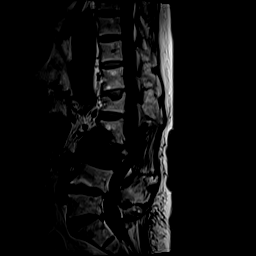
[im 13/17]
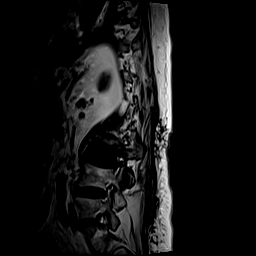
[im 17/17]
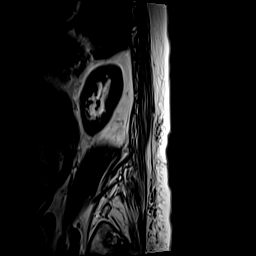

[Series 9: T1 · axial · 4.0mm · 0.35mm/px · z∈[-97,+95]mm · 7 of 43 slices shown (2 of 2)]
[im 1/43]
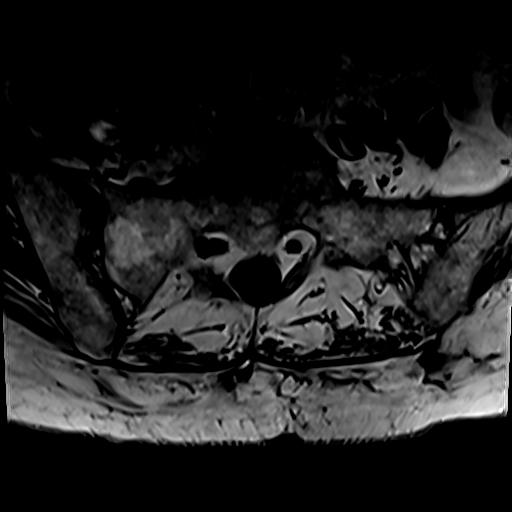
[im 5/43]
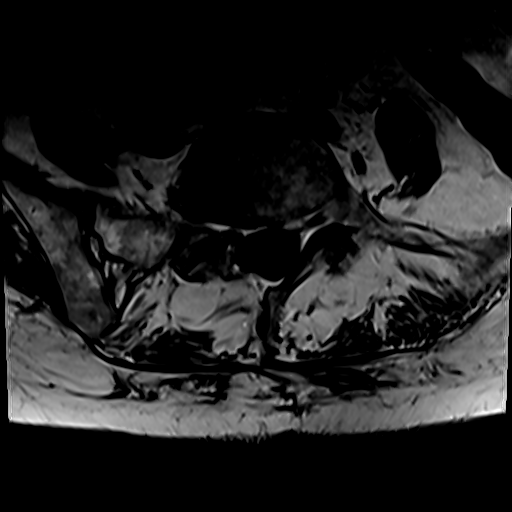
[im 15/43]
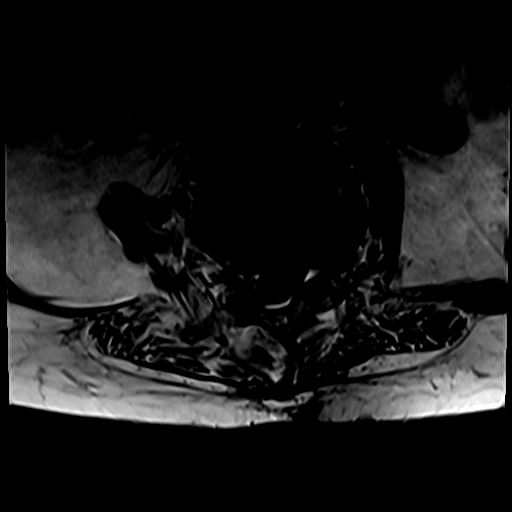
[im 19/43]
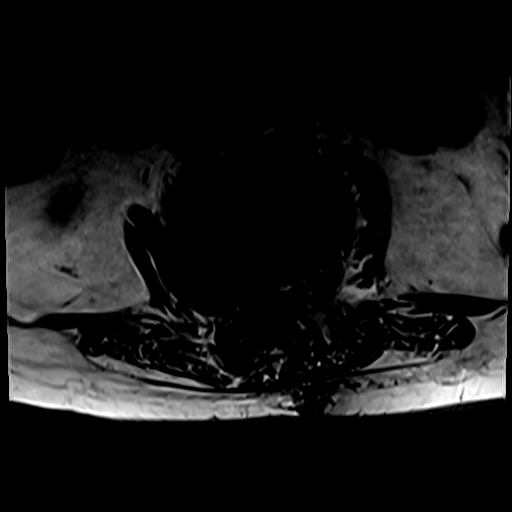
[im 24/43]
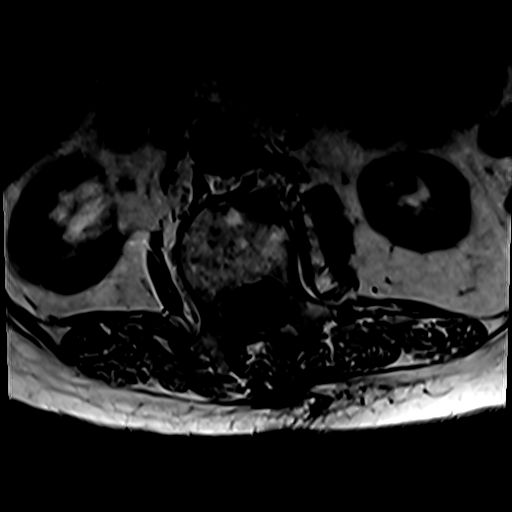
[im 29/43]
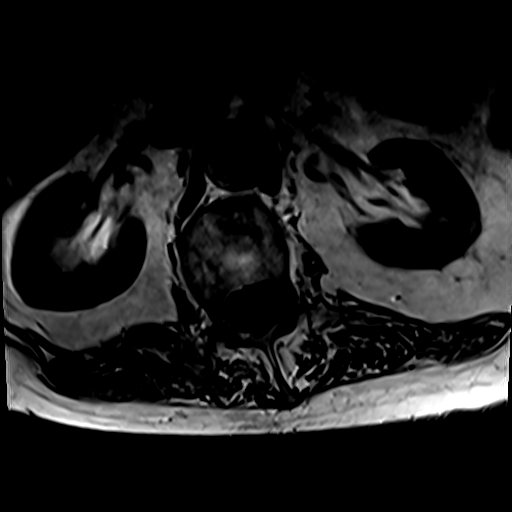
[im 38/43]
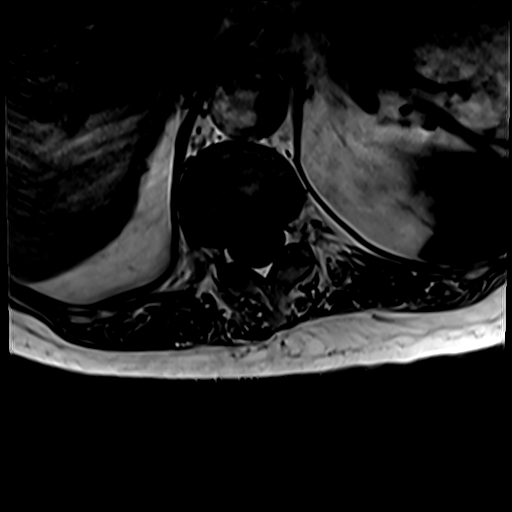

[Series 12: T2 · axial · 4.0mm · 0.35mm/px · z∈[-97,+120]mm · 8 of 43 slices shown (1 of 2)]
[im 1/43]
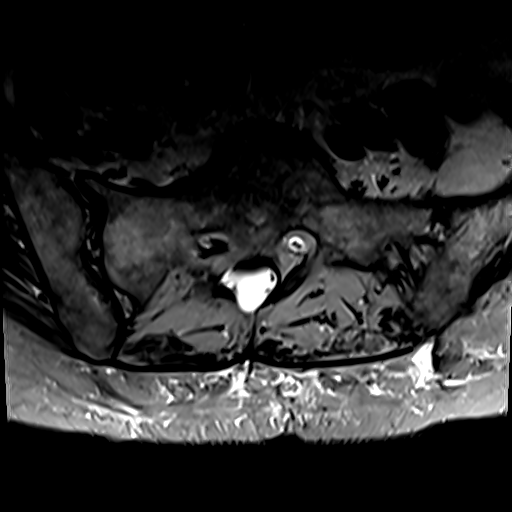
[im 5/43]
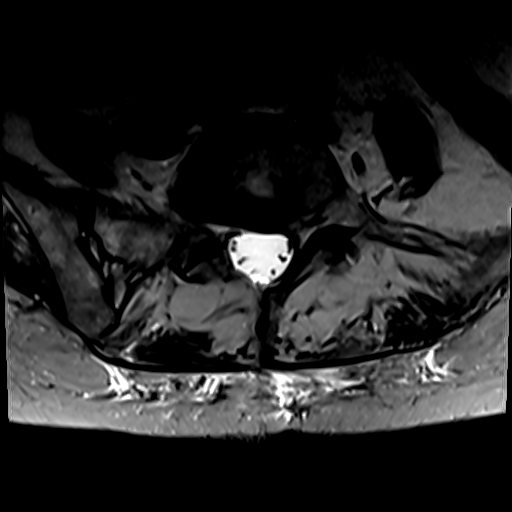
[im 15/43]
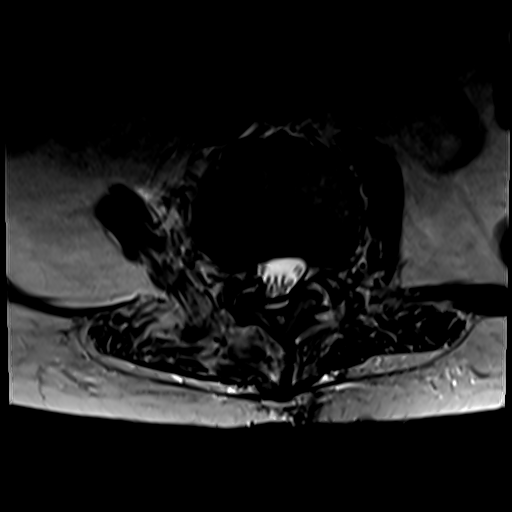
[im 19/43]
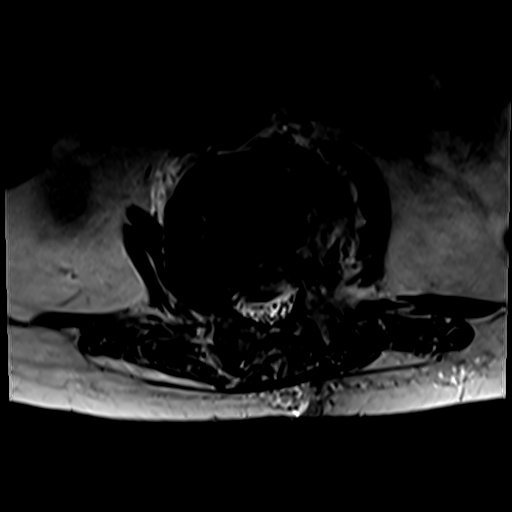
[im 24/43]
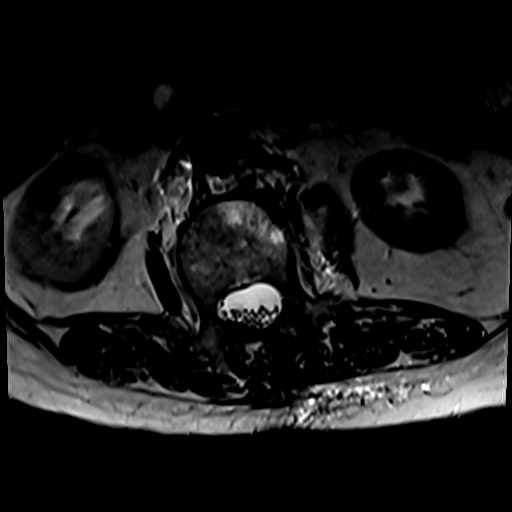
[im 29/43]
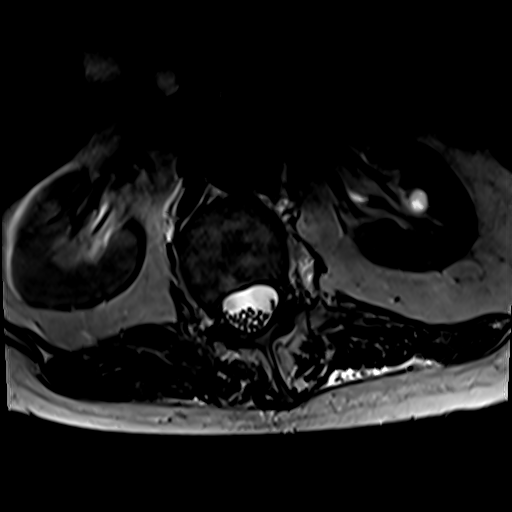
[im 38/43]
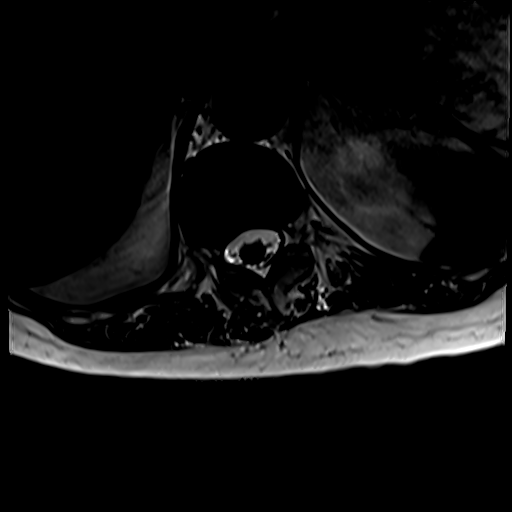
[im 43/43]
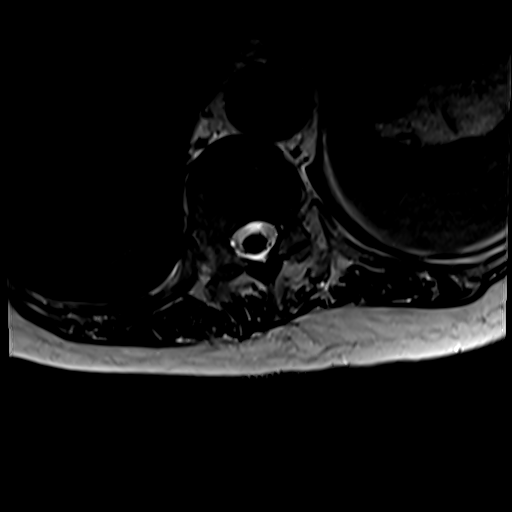

[Series 13: T2 · sagittal · 4.0mm · 1.09mm/px · 4 of 17 slices shown (2 of 2)]
[im 1/17]
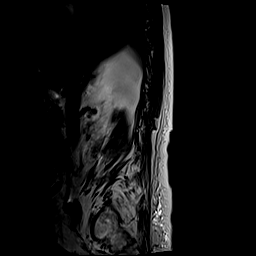
[im 6/17]
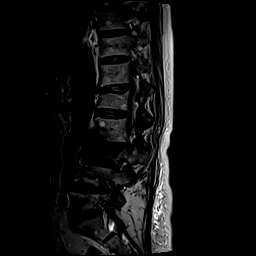
[im 11/17]
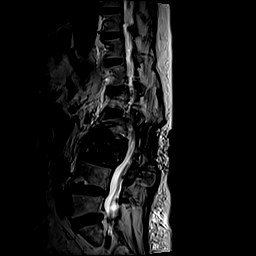
[im 17/17]
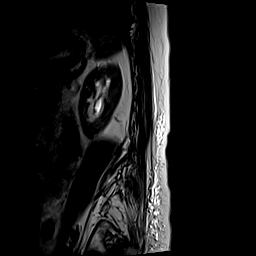

[24 of 48 positions shown; findings below may reference images not displayed]

FINDINGS: Segmentation:  Standard.

Alignment: Levoconvex scoliosis of the lumbar spine. Retrolisthesis
of L3 over L4 and L4 over L5.

Vertebrae: Compression fracture of L3 status post vertebral
augmentation with persistent marrow edema within the remainder of
the vertebral body, extending to the corresponding pedicles and
persistent retropulsion. There is new compression fracture of the
inferior endplate of L2, mostly in its anterior aspect with
associated marrow edema, consistent with acute/subacute process. No
retropulsion. Chronic compression fracture of the L1 vertebral body
with minimal retropulsion. Postsurgical changes from postsurgical
changes from L2 and L3 laminectomies. Remote right sidelaminectomy
at L5-S1.

Conus medullaris and cauda equina: Conus extends to the T12-L1
level. Conus and cauda equina appear normal.

Paraspinal and other soft tissues: Negative.

Disc levels:

T12-L1 small retropulsion of the superior aspect of the L1 posterior
wall causing indentation on the thecal sac. No significant spinal
canal or neural foraminal stenosis.

L1-2: Shallow disc bulge and mild facet degenerative changes without
significant spinal canal or neural foraminal stenosis.

L2-3: Retropulsion of the L3 posterior wall causing indentation of
the thecal sac without significant spinal canal stenosis. Facet
degenerative changes. Moderate bilateral neural foraminal narrowing.

L3-4: Shallow disc bulge and moderate facet degenerative changes
resulting in narrowing of the right subarticular zone and moderate
bilateral neural foraminal narrowing

L4-5: Disc bulge, moderate facet degenerative changes, right greater
than left, resulting in severe right and mild left neural foraminal
narrowing. No significant spinal canal stenosis.

L5-S1: Disc bulge and facet degenerative changes resulting in severe
right neural foraminal narrowing
IMPRESSION: 1. New acute/subacute compression fracture of the inferior endplate
of L2 without significant height loss or retropulsion.
2. Compression fracture of L3 status post vertebral augmentation
with persistent marrow edema. Interval improvement of the degree of
spinal canal stenosis at this level due to laminectomy.
3. Degenerative changes of the lumbar spine with severe right neural
foraminal narrowing at L4-5 and L5-S1.

## 2021-03-17 MED ORDER — GADOBENATE DIMEGLUMINE 529 MG/ML IV SOLN
10.0000 mL | Freq: Once | INTRAVENOUS | Status: AC | PRN
  Administered 2021-03-17: 10 mL via INTRAVENOUS

## 2022-02-22 ENCOUNTER — Other Ambulatory Visit: Payer: Self-pay | Admitting: Neurosurgery

## 2022-02-22 DIAGNOSIS — M48062 Spinal stenosis, lumbar region with neurogenic claudication: Secondary | ICD-10-CM

## 2022-03-18 ENCOUNTER — Other Ambulatory Visit: Payer: Medicare Other

## 2022-03-27 DIAGNOSIS — K219 Gastro-esophageal reflux disease without esophagitis: Secondary | ICD-10-CM | POA: Diagnosis not present

## 2022-03-27 DIAGNOSIS — Z79899 Other long term (current) drug therapy: Secondary | ICD-10-CM | POA: Diagnosis not present

## 2022-03-27 DIAGNOSIS — E7800 Pure hypercholesterolemia, unspecified: Secondary | ICD-10-CM | POA: Diagnosis not present

## 2022-03-27 DIAGNOSIS — R1030 Lower abdominal pain, unspecified: Secondary | ICD-10-CM | POA: Diagnosis not present

## 2022-03-27 DIAGNOSIS — Z8781 Personal history of (healed) traumatic fracture: Secondary | ICD-10-CM | POA: Diagnosis not present

## 2022-03-27 DIAGNOSIS — M810 Age-related osteoporosis without current pathological fracture: Secondary | ICD-10-CM | POA: Diagnosis not present

## 2022-03-27 DIAGNOSIS — G894 Chronic pain syndrome: Secondary | ICD-10-CM | POA: Diagnosis not present

## 2022-03-27 DIAGNOSIS — T402X5A Adverse effect of other opioids, initial encounter: Secondary | ICD-10-CM | POA: Diagnosis not present

## 2022-03-27 DIAGNOSIS — K5903 Drug induced constipation: Secondary | ICD-10-CM | POA: Diagnosis not present

## 2022-03-29 DIAGNOSIS — G894 Chronic pain syndrome: Secondary | ICD-10-CM | POA: Diagnosis not present

## 2022-03-29 DIAGNOSIS — M48062 Spinal stenosis, lumbar region with neurogenic claudication: Secondary | ICD-10-CM | POA: Diagnosis not present

## 2022-04-02 ENCOUNTER — Ambulatory Visit
Admission: RE | Admit: 2022-04-02 | Discharge: 2022-04-02 | Disposition: A | Discharge status: Home / Self Care | Payer: Medicare Other | Source: Ambulatory Visit | Attending: Neurosurgery | Admitting: Neurosurgery

## 2022-04-02 DIAGNOSIS — M545 Low back pain, unspecified: Secondary | ICD-10-CM | POA: Diagnosis not present

## 2022-04-02 DIAGNOSIS — M48061 Spinal stenosis, lumbar region without neurogenic claudication: Secondary | ICD-10-CM | POA: Diagnosis not present

## 2022-04-02 DIAGNOSIS — S32030A Wedge compression fracture of third lumbar vertebra, initial encounter for closed fracture: Secondary | ICD-10-CM | POA: Diagnosis not present

## 2022-04-02 DIAGNOSIS — M48062 Spinal stenosis, lumbar region with neurogenic claudication: Secondary | ICD-10-CM

## 2022-04-02 IMAGING — MR MR LUMBAR SPINE W/O CM
4 of 6 series · 26 of 48 positions shown · non-contrast
Comparison: [DATE]

CLINICAL DATA: Bilateral hip pain for 2 years.

EXAM:
MRI LUMBAR SPINE WITHOUT CONTRAST
TECHNIQUE: Multiplanar, multisequence MR imaging of the lumbar spine was
performed. No intravenous contrast was administered.

[Series 3: T2 · coronal · 4.0mm · 0.55mm/px · 6 of 13 slices shown (1 of 3)]
[im 1/13]
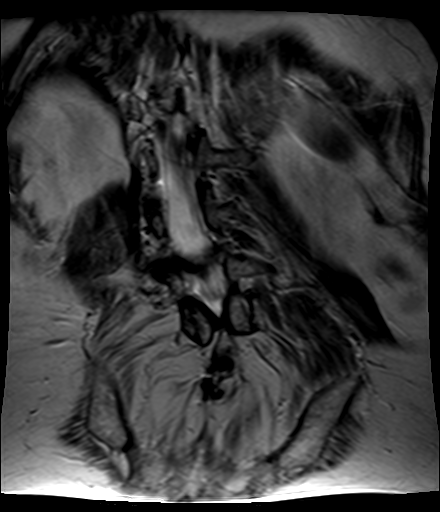
[im 3/13]
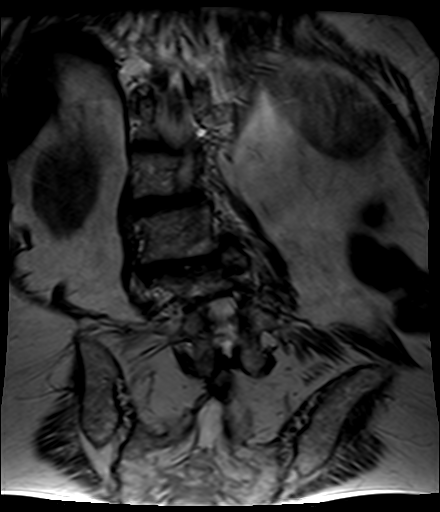
[im 5/13]
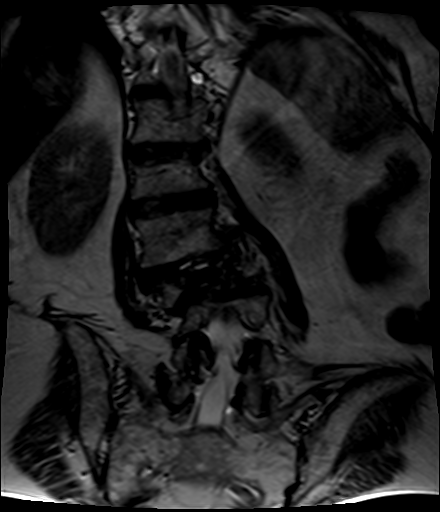
[im 8/13]
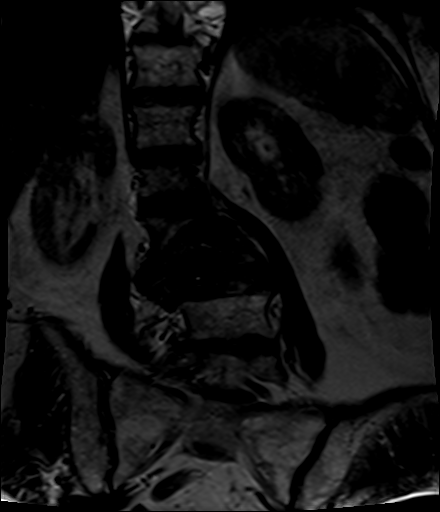
[im 10/13]
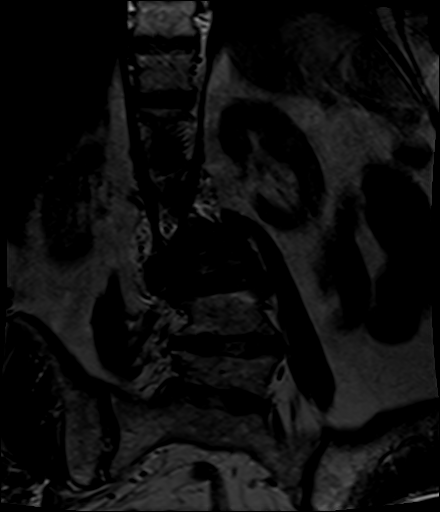
[im 13/13]
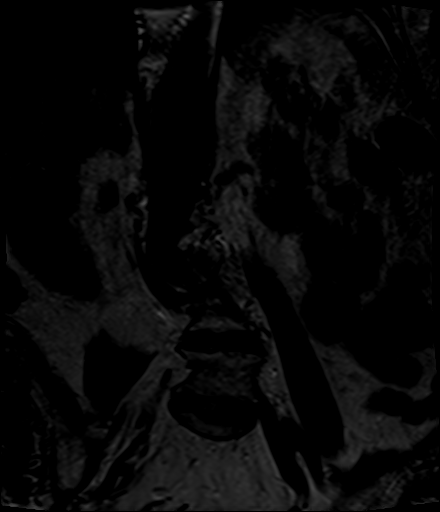

[Series 4: T2 · sagittal · 4.0mm · 0.53mm/px · 6 of 17 slices shown (2 of 3)]
[im 1/17]
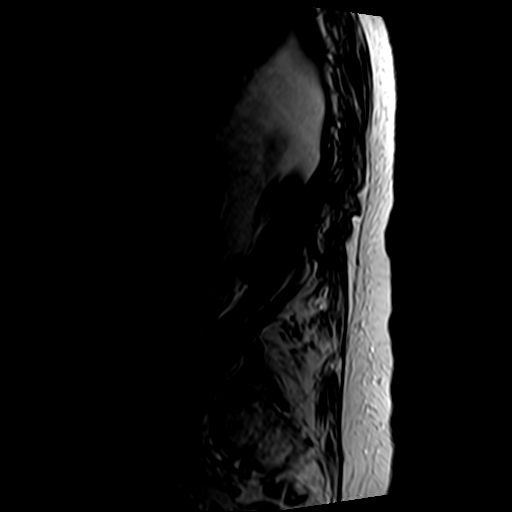
[im 4/17]
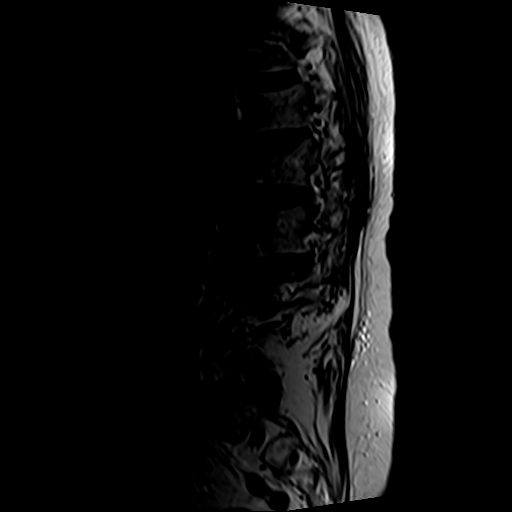
[im 7/17]
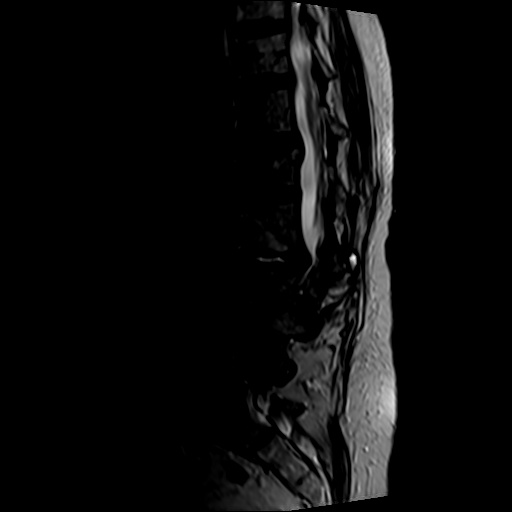
[im 10/17]
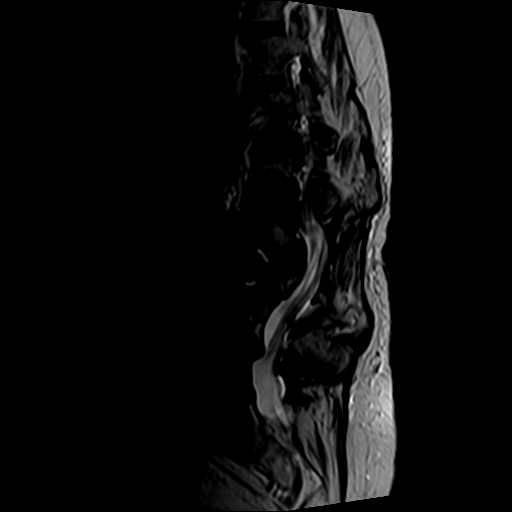
[im 13/17]
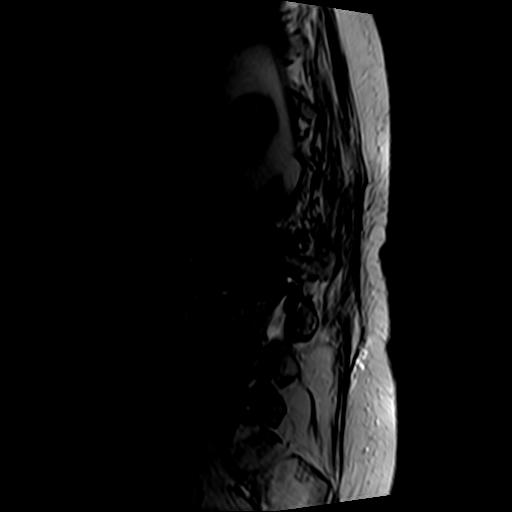
[im 17/17]
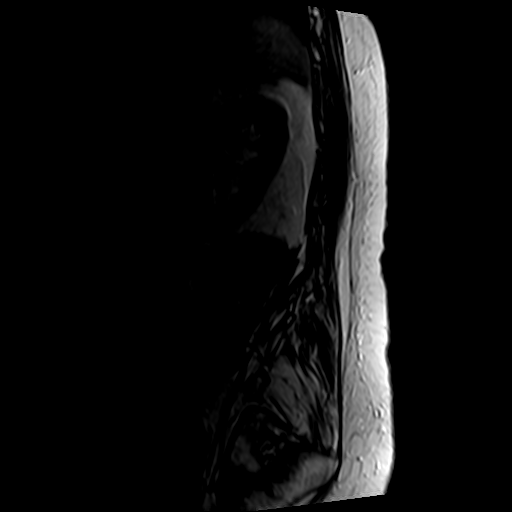

[Series 6: T1 · sagittal · 4.0mm · 0.53mm/px · 5 of 17 slices shown]
[im 1/17]
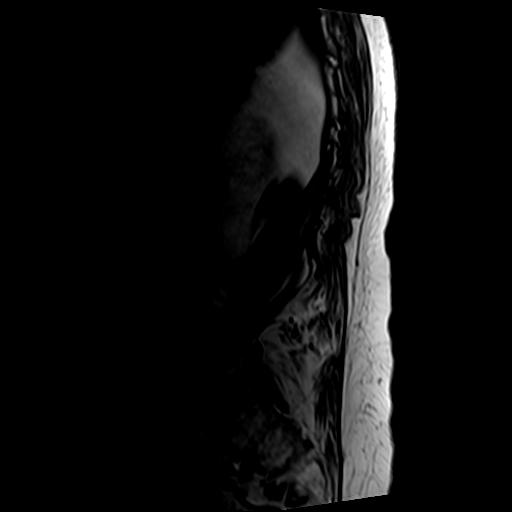
[im 4/17]
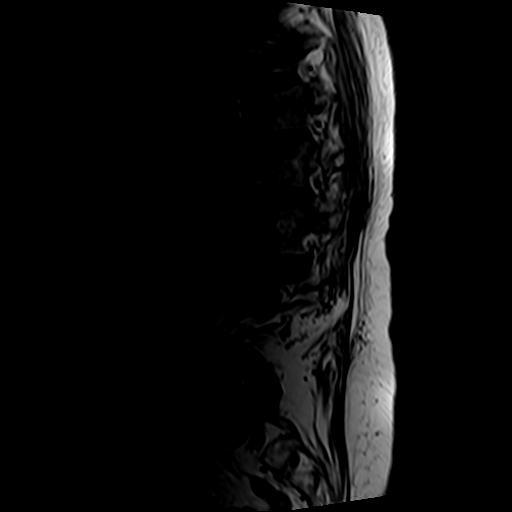
[im 7/17]
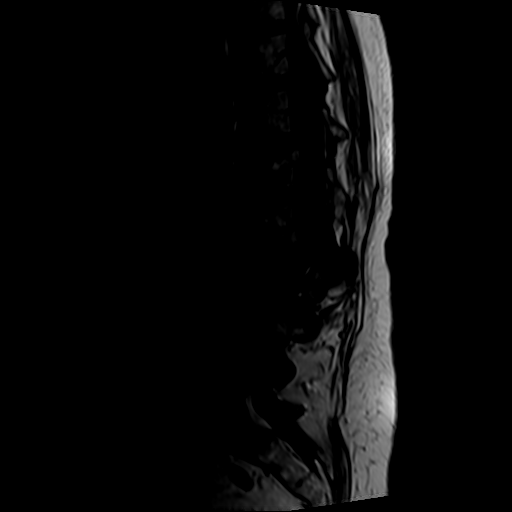
[im 10/17]
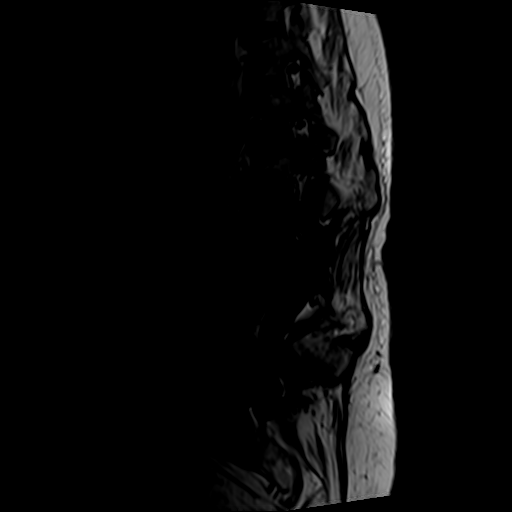
[im 17/17]
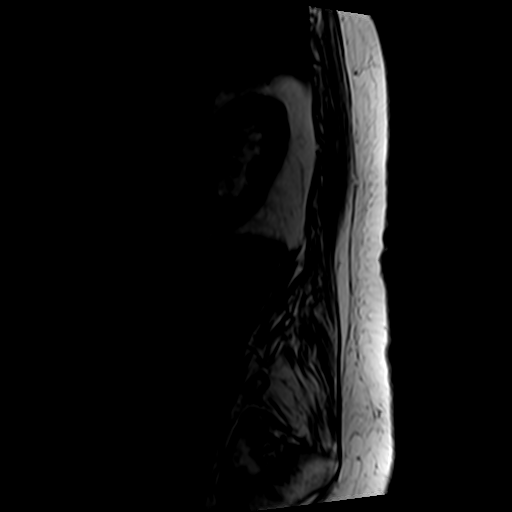

[Series 7: T2 · axial · 4.0mm · 0.70mm/px · z∈[-127,+48]mm · 9 of 32 slices shown (3 of 3)]
[im 1/32]
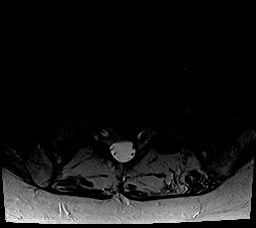
[im 6/32]
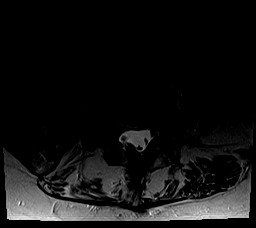
[im 9/32]
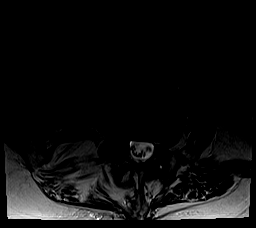
[im 15/32]
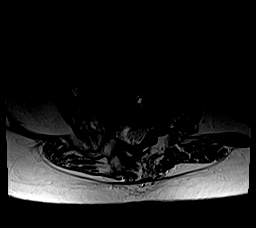
[im 17/32]
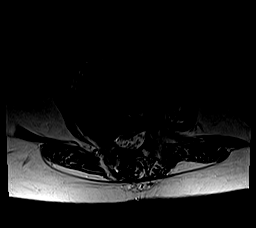
[im 23/32]
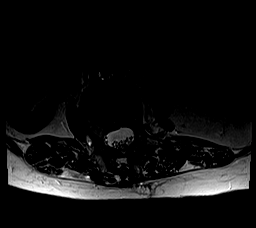
[im 26/32]
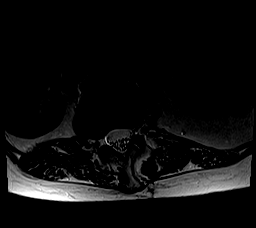
[im 29/32]
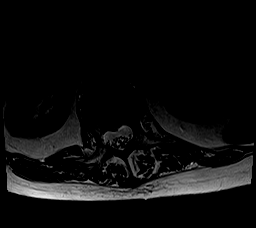
[im 32/32]
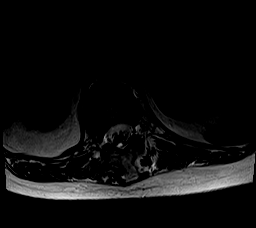

[26 of 48 positions shown; findings below may reference images not displayed]

FINDINGS: Segmentation:  Standard.

Alignment: There is S shaped scoliosis right apex at L2 and left
apex at L4. unchanged grade 1 retrolisthesis at L3-4 and L4-5.

Vertebrae: Chronic compression fracture of L3 status post
augmentation. Improved bone marrow edema within L2.

Conus medullaris and cauda equina: Conus extends to the L1 level.
Conus and cauda equina appear normal.

Paraspinal and other soft tissues: Negative.

Disc levels:

L1-L2: Normal disc space and facet joints. No spinal canal stenosis.
No neural foraminal stenosis.

L2-L3: Remote laminectomies with medium-sized disc bulge and
endplate spurring, unchanged. No spinal canal stenosis. Unchanged
severe left neural foraminal stenosis.

L3-L4: Unchanged small disc bulge and posterior bulging of the L3
vertebral body. No spinal canal stenosis. Unchanged severe
bilateral neural foraminal stenosis.

L4-L5: Unchanged small disc bulge. No spinal canal stenosis.
Unchanged severe right and moderate left neural foraminal stenosis.

L5-S1: Unchanged small disc bulge. No spinal canal stenosis.
Unchanged severe right neural foraminal stenosis.

Visualized sacrum: Normal.
IMPRESSION: 1. Unchanged severe neural foraminal stenosis at left L2-3,
bilateral L3-4, right L4-5 and right L5-S1.
2. Chronic L3 compression fracture.

## 2022-04-05 ENCOUNTER — Other Ambulatory Visit: Payer: Self-pay | Admitting: Neurosurgery

## 2022-04-05 DIAGNOSIS — S32030A Wedge compression fracture of third lumbar vertebra, initial encounter for closed fracture: Secondary | ICD-10-CM | POA: Diagnosis not present

## 2022-04-05 DIAGNOSIS — M48062 Spinal stenosis, lumbar region with neurogenic claudication: Secondary | ICD-10-CM | POA: Diagnosis not present

## 2022-04-16 ENCOUNTER — Encounter (HOSPITAL_COMMUNITY): Payer: Medicare Other

## 2022-04-23 ENCOUNTER — Other Ambulatory Visit (HOSPITAL_COMMUNITY): Payer: Self-pay | Admitting: *Deleted

## 2022-04-24 ENCOUNTER — Ambulatory Visit (HOSPITAL_COMMUNITY)
Admission: RE | Admit: 2022-04-24 | Discharge: 2022-04-24 | Disposition: A | Discharge status: Home / Self Care | Payer: Medicare Other | Source: Ambulatory Visit | Attending: Family Medicine | Admitting: Family Medicine

## 2022-04-24 DIAGNOSIS — M810 Age-related osteoporosis without current pathological fracture: Secondary | ICD-10-CM | POA: Insufficient documentation

## 2022-04-24 MED ORDER — ZOLEDRONIC ACID 5 MG/100ML IV SOLN: 5.0000 mg | Freq: Once | INTRAVENOUS | Status: AC

## 2022-04-24 MED ORDER — ZOLEDRONIC ACID 5 MG/100ML IV SOLN
INTRAVENOUS | Status: AC
  Administered 2022-04-24: 5 mg via INTRAVENOUS
  Filled 2022-04-24: qty 100

## 2022-05-29 ENCOUNTER — Ambulatory Visit
Admission: RE | Admit: 2022-05-29 | Discharge: 2022-05-29 | Disposition: A | Discharge status: Home / Self Care | Payer: Medicare Other | Source: Ambulatory Visit | Attending: Neurosurgery | Admitting: Neurosurgery

## 2022-05-29 DIAGNOSIS — S32020A Wedge compression fracture of second lumbar vertebra, initial encounter for closed fracture: Secondary | ICD-10-CM | POA: Diagnosis not present

## 2022-05-29 DIAGNOSIS — S32040A Wedge compression fracture of fourth lumbar vertebra, initial encounter for closed fracture: Secondary | ICD-10-CM | POA: Diagnosis not present

## 2022-05-29 DIAGNOSIS — S32030A Wedge compression fracture of third lumbar vertebra, initial encounter for closed fracture: Secondary | ICD-10-CM

## 2022-05-29 DIAGNOSIS — M419 Scoliosis, unspecified: Secondary | ICD-10-CM | POA: Diagnosis not present

## 2022-06-06 DIAGNOSIS — S32030A Wedge compression fracture of third lumbar vertebra, initial encounter for closed fracture: Secondary | ICD-10-CM | POA: Diagnosis not present

## 2022-06-14 DIAGNOSIS — M48062 Spinal stenosis, lumbar region with neurogenic claudication: Secondary | ICD-10-CM | POA: Diagnosis not present

## 2022-06-14 DIAGNOSIS — G894 Chronic pain syndrome: Secondary | ICD-10-CM | POA: Diagnosis not present

## 2022-09-11 DIAGNOSIS — M48062 Spinal stenosis, lumbar region with neurogenic claudication: Secondary | ICD-10-CM | POA: Diagnosis not present

## 2022-09-11 DIAGNOSIS — G894 Chronic pain syndrome: Secondary | ICD-10-CM | POA: Diagnosis not present

## 2022-10-04 DIAGNOSIS — M4316 Spondylolisthesis, lumbar region: Secondary | ICD-10-CM | POA: Diagnosis not present

## 2022-12-04 DIAGNOSIS — G894 Chronic pain syndrome: Secondary | ICD-10-CM | POA: Diagnosis not present

## 2022-12-04 DIAGNOSIS — M4316 Spondylolisthesis, lumbar region: Secondary | ICD-10-CM | POA: Diagnosis not present

## 2022-12-06 DIAGNOSIS — M4316 Spondylolisthesis, lumbar region: Secondary | ICD-10-CM | POA: Diagnosis not present

## 2022-12-18 DIAGNOSIS — M810 Age-related osteoporosis without current pathological fracture: Secondary | ICD-10-CM | POA: Diagnosis not present

## 2022-12-18 DIAGNOSIS — I10 Essential (primary) hypertension: Secondary | ICD-10-CM | POA: Diagnosis not present

## 2022-12-18 DIAGNOSIS — Z6823 Body mass index (BMI) 23.0-23.9, adult: Secondary | ICD-10-CM | POA: Diagnosis not present

## 2022-12-18 DIAGNOSIS — L219 Seborrheic dermatitis, unspecified: Secondary | ICD-10-CM | POA: Diagnosis not present

## 2023-01-09 DIAGNOSIS — L218 Other seborrheic dermatitis: Secondary | ICD-10-CM | POA: Diagnosis not present

## 2023-03-04 DIAGNOSIS — G894 Chronic pain syndrome: Secondary | ICD-10-CM | POA: Diagnosis not present

## 2023-03-04 DIAGNOSIS — M48062 Spinal stenosis, lumbar region with neurogenic claudication: Secondary | ICD-10-CM | POA: Diagnosis not present

## 2023-03-27 DIAGNOSIS — Z6824 Body mass index (BMI) 24.0-24.9, adult: Secondary | ICD-10-CM | POA: Diagnosis not present

## 2023-03-27 DIAGNOSIS — G894 Chronic pain syndrome: Secondary | ICD-10-CM | POA: Diagnosis not present

## 2023-03-27 DIAGNOSIS — M549 Dorsalgia, unspecified: Secondary | ICD-10-CM | POA: Diagnosis not present

## 2023-03-27 DIAGNOSIS — I10 Essential (primary) hypertension: Secondary | ICD-10-CM | POA: Diagnosis not present

## 2023-03-27 DIAGNOSIS — M810 Age-related osteoporosis without current pathological fracture: Secondary | ICD-10-CM | POA: Diagnosis not present

## 2023-04-01 ENCOUNTER — Other Ambulatory Visit: Payer: Self-pay | Admitting: Pain Medicine

## 2023-04-01 DIAGNOSIS — M810 Age-related osteoporosis without current pathological fracture: Secondary | ICD-10-CM

## 2023-04-04 DIAGNOSIS — M48062 Spinal stenosis, lumbar region with neurogenic claudication: Secondary | ICD-10-CM | POA: Diagnosis not present

## 2023-06-21 DIAGNOSIS — G894 Chronic pain syndrome: Secondary | ICD-10-CM | POA: Diagnosis not present

## 2023-07-04 DIAGNOSIS — M48062 Spinal stenosis, lumbar region with neurogenic claudication: Secondary | ICD-10-CM | POA: Diagnosis not present

## 2023-07-16 DIAGNOSIS — Z0000 Encounter for general adult medical examination without abnormal findings: Secondary | ICD-10-CM | POA: Diagnosis not present

## 2023-07-16 DIAGNOSIS — N1831 Chronic kidney disease, stage 3a: Secondary | ICD-10-CM | POA: Diagnosis not present

## 2023-07-16 DIAGNOSIS — E785 Hyperlipidemia, unspecified: Secondary | ICD-10-CM | POA: Diagnosis not present

## 2023-07-16 DIAGNOSIS — M549 Dorsalgia, unspecified: Secondary | ICD-10-CM | POA: Diagnosis not present

## 2023-07-16 DIAGNOSIS — J309 Allergic rhinitis, unspecified: Secondary | ICD-10-CM | POA: Diagnosis not present

## 2023-07-16 DIAGNOSIS — Z1239 Encounter for other screening for malignant neoplasm of breast: Secondary | ICD-10-CM | POA: Diagnosis not present

## 2023-07-16 DIAGNOSIS — I10 Essential (primary) hypertension: Secondary | ICD-10-CM | POA: Diagnosis not present

## 2023-07-16 DIAGNOSIS — K219 Gastro-esophageal reflux disease without esophagitis: Secondary | ICD-10-CM | POA: Diagnosis not present

## 2023-07-16 DIAGNOSIS — E559 Vitamin D deficiency, unspecified: Secondary | ICD-10-CM | POA: Diagnosis not present

## 2023-07-16 DIAGNOSIS — M810 Age-related osteoporosis without current pathological fracture: Secondary | ICD-10-CM | POA: Diagnosis not present

## 2023-07-18 ENCOUNTER — Other Ambulatory Visit: Payer: Self-pay | Admitting: Pain Medicine

## 2023-07-18 DIAGNOSIS — Z1231 Encounter for screening mammogram for malignant neoplasm of breast: Secondary | ICD-10-CM

## 2023-07-25 ENCOUNTER — Ambulatory Visit: Payer: Medicare Other | Admitting: Obstetrics and Gynecology

## 2023-08-06 DIAGNOSIS — M545 Low back pain, unspecified: Secondary | ICD-10-CM | POA: Diagnosis not present

## 2023-08-07 ENCOUNTER — Encounter: Payer: Medicare Other | Admitting: Medical

## 2023-08-20 DIAGNOSIS — R030 Elevated blood-pressure reading, without diagnosis of hypertension: Secondary | ICD-10-CM | POA: Diagnosis not present

## 2023-08-20 DIAGNOSIS — M545 Low back pain, unspecified: Secondary | ICD-10-CM | POA: Diagnosis not present

## 2023-08-27 DIAGNOSIS — M549 Dorsalgia, unspecified: Secondary | ICD-10-CM | POA: Diagnosis not present

## 2023-08-27 DIAGNOSIS — M810 Age-related osteoporosis without current pathological fracture: Secondary | ICD-10-CM | POA: Diagnosis not present

## 2023-08-27 DIAGNOSIS — H44002 Unspecified purulent endophthalmitis, left eye: Secondary | ICD-10-CM | POA: Diagnosis not present

## 2023-08-27 DIAGNOSIS — I10 Essential (primary) hypertension: Secondary | ICD-10-CM | POA: Diagnosis not present

## 2023-09-09 DIAGNOSIS — Z79899 Other long term (current) drug therapy: Secondary | ICD-10-CM | POA: Diagnosis not present

## 2023-09-09 DIAGNOSIS — G8929 Other chronic pain: Secondary | ICD-10-CM | POA: Diagnosis not present

## 2023-09-09 DIAGNOSIS — M549 Dorsalgia, unspecified: Secondary | ICD-10-CM | POA: Diagnosis not present

## 2023-09-09 DIAGNOSIS — S32000A Wedge compression fracture of unspecified lumbar vertebra, initial encounter for closed fracture: Secondary | ICD-10-CM | POA: Diagnosis not present

## 2023-09-11 DIAGNOSIS — Z79899 Other long term (current) drug therapy: Secondary | ICD-10-CM | POA: Diagnosis not present

## 2023-09-25 DIAGNOSIS — M549 Dorsalgia, unspecified: Secondary | ICD-10-CM | POA: Diagnosis not present

## 2023-09-25 DIAGNOSIS — R052 Subacute cough: Secondary | ICD-10-CM | POA: Diagnosis not present

## 2023-09-25 DIAGNOSIS — G4709 Other insomnia: Secondary | ICD-10-CM | POA: Diagnosis not present

## 2023-10-01 DIAGNOSIS — Z79899 Other long term (current) drug therapy: Secondary | ICD-10-CM | POA: Diagnosis not present

## 2023-10-01 DIAGNOSIS — S32000A Wedge compression fracture of unspecified lumbar vertebra, initial encounter for closed fracture: Secondary | ICD-10-CM | POA: Diagnosis not present

## 2023-10-01 DIAGNOSIS — M549 Dorsalgia, unspecified: Secondary | ICD-10-CM | POA: Diagnosis not present

## 2023-10-01 DIAGNOSIS — G8929 Other chronic pain: Secondary | ICD-10-CM | POA: Diagnosis not present

## 2023-10-29 DIAGNOSIS — Z79899 Other long term (current) drug therapy: Secondary | ICD-10-CM | POA: Diagnosis not present

## 2023-10-29 DIAGNOSIS — G8929 Other chronic pain: Secondary | ICD-10-CM | POA: Diagnosis not present

## 2023-10-29 DIAGNOSIS — S32000A Wedge compression fracture of unspecified lumbar vertebra, initial encounter for closed fracture: Secondary | ICD-10-CM | POA: Diagnosis not present

## 2023-10-29 DIAGNOSIS — M549 Dorsalgia, unspecified: Secondary | ICD-10-CM | POA: Diagnosis not present

## 2023-11-28 DIAGNOSIS — Z79899 Other long term (current) drug therapy: Secondary | ICD-10-CM | POA: Diagnosis not present

## 2023-11-28 DIAGNOSIS — G8929 Other chronic pain: Secondary | ICD-10-CM | POA: Diagnosis not present

## 2023-11-28 DIAGNOSIS — S32000A Wedge compression fracture of unspecified lumbar vertebra, initial encounter for closed fracture: Secondary | ICD-10-CM | POA: Diagnosis not present

## 2023-11-28 DIAGNOSIS — M549 Dorsalgia, unspecified: Secondary | ICD-10-CM | POA: Diagnosis not present

## 2023-12-04 DIAGNOSIS — M48062 Spinal stenosis, lumbar region with neurogenic claudication: Secondary | ICD-10-CM | POA: Diagnosis not present

## 2023-12-13 ENCOUNTER — Other Ambulatory Visit: Payer: Medicare Other

## 2023-12-17 DIAGNOSIS — L309 Dermatitis, unspecified: Secondary | ICD-10-CM | POA: Diagnosis not present

## 2023-12-17 DIAGNOSIS — L218 Other seborrheic dermatitis: Secondary | ICD-10-CM | POA: Diagnosis not present

## 2024-01-18 DIAGNOSIS — M549 Dorsalgia, unspecified: Secondary | ICD-10-CM | POA: Diagnosis not present

## 2024-01-18 DIAGNOSIS — Z79899 Other long term (current) drug therapy: Secondary | ICD-10-CM | POA: Diagnosis not present

## 2024-01-18 DIAGNOSIS — S32000A Wedge compression fracture of unspecified lumbar vertebra, initial encounter for closed fracture: Secondary | ICD-10-CM | POA: Diagnosis not present

## 2024-01-18 DIAGNOSIS — G894 Chronic pain syndrome: Secondary | ICD-10-CM | POA: Diagnosis not present

## 2024-01-30 DIAGNOSIS — Z79899 Other long term (current) drug therapy: Secondary | ICD-10-CM | POA: Diagnosis not present

## 2024-01-30 DIAGNOSIS — S32000A Wedge compression fracture of unspecified lumbar vertebra, initial encounter for closed fracture: Secondary | ICD-10-CM | POA: Diagnosis not present

## 2024-01-30 DIAGNOSIS — M549 Dorsalgia, unspecified: Secondary | ICD-10-CM | POA: Diagnosis not present

## 2024-01-30 DIAGNOSIS — G8929 Other chronic pain: Secondary | ICD-10-CM | POA: Diagnosis not present

## 2024-01-30 DIAGNOSIS — G894 Chronic pain syndrome: Secondary | ICD-10-CM | POA: Diagnosis not present

## 2024-04-27 DIAGNOSIS — E785 Hyperlipidemia, unspecified: Secondary | ICD-10-CM | POA: Diagnosis not present

## 2024-04-27 DIAGNOSIS — N1831 Chronic kidney disease, stage 3a: Secondary | ICD-10-CM | POA: Diagnosis not present

## 2024-05-28 DIAGNOSIS — E785 Hyperlipidemia, unspecified: Secondary | ICD-10-CM | POA: Diagnosis not present

## 2024-05-28 DIAGNOSIS — N1831 Chronic kidney disease, stage 3a: Secondary | ICD-10-CM | POA: Diagnosis not present

## 2024-05-31 ENCOUNTER — Emergency Department (HOSPITAL_COMMUNITY)
Admission: EM | Admit: 2024-05-31 | Discharge: 2024-05-31 | Disposition: A | Discharge status: Home / Self Care | Attending: Emergency Medicine | Admitting: Emergency Medicine

## 2024-05-31 ENCOUNTER — Other Ambulatory Visit: Payer: Self-pay

## 2024-05-31 ENCOUNTER — Emergency Department (HOSPITAL_COMMUNITY)

## 2024-05-31 DIAGNOSIS — Z79899 Other long term (current) drug therapy: Secondary | ICD-10-CM | POA: Insufficient documentation

## 2024-05-31 DIAGNOSIS — M5416 Radiculopathy, lumbar region: Secondary | ICD-10-CM | POA: Diagnosis not present

## 2024-05-31 DIAGNOSIS — M4856XA Collapsed vertebra, not elsewhere classified, lumbar region, initial encounter for fracture: Secondary | ICD-10-CM | POA: Diagnosis not present

## 2024-05-31 DIAGNOSIS — M549 Dorsalgia, unspecified: Secondary | ICD-10-CM | POA: Diagnosis not present

## 2024-05-31 DIAGNOSIS — I10 Essential (primary) hypertension: Secondary | ICD-10-CM | POA: Diagnosis not present

## 2024-05-31 DIAGNOSIS — M48061 Spinal stenosis, lumbar region without neurogenic claudication: Secondary | ICD-10-CM | POA: Diagnosis not present

## 2024-05-31 DIAGNOSIS — M5136 Other intervertebral disc degeneration, lumbar region with discogenic back pain only: Secondary | ICD-10-CM | POA: Diagnosis not present

## 2024-05-31 DIAGNOSIS — M47816 Spondylosis without myelopathy or radiculopathy, lumbar region: Secondary | ICD-10-CM | POA: Diagnosis not present

## 2024-05-31 LAB — CBC WITH DIFFERENTIAL/PLATELET
Abs Immature Granulocytes: 0.03 K/uL (ref 0.00–0.07)
Basophils Absolute: 0 K/uL (ref 0.0–0.1)
Basophils Relative: 0 %
Eosinophils Absolute: 0.1 K/uL (ref 0.0–0.5)
Eosinophils Relative: 1 %
HCT: 38.3 % (ref 36.0–46.0)
Hemoglobin: 11.9 g/dL — ABNORMAL LOW (ref 12.0–15.0)
Immature Granulocytes: 0 %
Lymphocytes Relative: 19 %
Lymphs Abs: 1.3 K/uL (ref 0.7–4.0)
MCH: 27.3 pg (ref 26.0–34.0)
MCHC: 31.1 g/dL (ref 30.0–36.0)
MCV: 87.8 fL (ref 80.0–100.0)
Monocytes Absolute: 0.9 K/uL (ref 0.1–1.0)
Monocytes Relative: 12 %
Neutro Abs: 4.7 K/uL (ref 1.7–7.7)
Neutrophils Relative %: 68 %
Platelets: 399 K/uL (ref 150–400)
RBC: 4.36 MIL/uL (ref 3.87–5.11)
RDW: 14.9 % (ref 11.5–15.5)
WBC: 7 K/uL (ref 4.0–10.5)
nRBC: 0 % (ref 0.0–0.2)

## 2024-05-31 LAB — BASIC METABOLIC PANEL WITH GFR
Anion gap: 12 (ref 5–15)
BUN: 20 mg/dL (ref 8–23)
CO2: 24 mmol/L (ref 22–32)
Calcium: 9.7 mg/dL (ref 8.9–10.3)
Chloride: 102 mmol/L (ref 98–111)
Creatinine, Ser: 0.92 mg/dL (ref 0.44–1.00)
GFR, Estimated: >60 mL/min (ref >60)
Glucose, Bld: 105 mg/dL — ABNORMAL HIGH (ref 70–99)
Potassium: 3.7 mmol/L (ref 3.5–5.1)
Sodium: 138 mmol/L (ref 135–145)

## 2024-05-31 MED ORDER — HYDROMORPHONE HCL 1 MG/ML IJ SOLN
1.0000 mg | Freq: Once | INTRAMUSCULAR | Status: AC
  Administered 2024-05-31: 1 mg via INTRAVENOUS
  Filled 2024-05-31: qty 1

## 2024-05-31 MED ORDER — METHYLPREDNISOLONE 4 MG PO TBPK: ORAL_TABLET | ORAL | 0 refills | Status: AC

## 2024-05-31 MED ORDER — DIAZEPAM 5 MG/ML IJ SOLN
2.5000 mg | Freq: Once | INTRAMUSCULAR | Status: AC
  Administered 2024-05-31: 2.5 mg via INTRAVENOUS
  Filled 2024-05-31: qty 2

## 2024-05-31 MED ORDER — OXYCODONE-ACETAMINOPHEN 10-325 MG PO TABS: 1.0000 | ORAL_TABLET | ORAL | 0 refills | Status: DC | PRN

## 2024-05-31 NOTE — Discharge Instructions (Signed)
 As we discussed, you have chronic lower back arthritis causing your pain.  You likely have a pinched nerve in your back  I have prescribed steroids and you should take as prescribed  I have also prescribed oxycodone  as needed for pain.  If you need refill of your oxycodone , you need to follow-up with your spine doctor or your primary care doctor or get a pain management doctor  Return to ER if you have worsening back pain or trouble walking or trouble urinating

## 2024-05-31 NOTE — ED Triage Notes (Signed)
 Pt BIB EMS from home. Pt Hx of back surgery in 2022. Pt c/o increased back pain these past two days. Pt hypertensive with ems, and reports missing her BP meds. Pt aox4.  192/100 95HR 18RR 96% RA

## 2024-05-31 NOTE — ED Provider Notes (Signed)
 Susan Grimes   CSN: [id number] Arrival date & time: 05/31/24  1933     Patient presents with: Back Pain   Susan Grimes is a 78 y.o. female hx of HTN, spinal stenosis s/p laminectomy here presenting with back pain.  Patient states that she ran out of her pain medicine about 6 weeks ago.  She has been having worsening back pain since then.  She states that the pain radiated down both legs.  Denies any trouble urinating.  Denies any fevers or chills.  She states that she had a fall several weeks ago but landed on the chair but did not hit her hip or back.  She states that she has no pain contract with her doctor right now.   The history is provided by the patient.       Prior to Admission medications   Medication Sig Start Date End Date Taking? Authorizing Provider  acetaminophen  (TYLENOL ) 500 MG tablet Take 1,000 mg by mouth every 6 (six) hours as needed for mild pain or moderate pain.    [provider]  albuterol  (VENTOLIN  HFA) 108 (90 Base) MCG/ACT inhaler Inhale 1 puff into the lungs every 4 (four) hours as needed for wheezing or shortness of breath. 11/12/19   [provider]  cyclobenzaprine  (FLEXERIL ) 10 MG tablet Take 1 tablet (10 mg total) by mouth 3 (three) times daily as needed for muscle spasms. 12/30/20   Bergman, Meghan D, NP  esomeprazole (NEXIUM) 20 MG capsule Take 20 mg by mouth daily at 12 noon.    [provider]  Fluticasone-Salmeterol (ADVAIR) 250-50 MCG/DOSE AEPB Inhale 1 puff into the lungs daily as needed. 12/07/19   [provider]  losartan  (COZAAR ) 25 MG tablet Take 25 mg by mouth daily. 12/03/19   [provider]  montelukast  (SINGULAIR ) 10 MG tablet Take 10 mg by mouth every evening. 02/04/20   [provider]  oxyCODONE -acetaminophen  (PERCOCET) 10-325 MG tablet Take 1 tablet by mouth every 4 (four) hours as needed for pain. 12/30/20   Bergman, Meghan D, NP     Allergies: Asa [aspirin] and Fentanyl     Review of Systems  Musculoskeletal:  Positive for back pain.  All other systems reviewed and are negative.   Updated Vital Signs BP (!) 164/99   Pulse 99   Temp 98 F (36.7 C)   Resp 18   SpO2 95%   Physical Exam Vitals and nursing Grimes reviewed.  Constitutional:      Comments: Uncomfortable   HENT:     Head: Normocephalic.     Nose: Nose normal.     Mouth/Throat:     Mouth: Mucous membranes are moist.  Eyes:     Extraocular Movements: Extraocular movements intact.     Pupils: Pupils are equal, round, and reactive to light.  Cardiovascular:     Rate and Rhythm: Normal rate and regular rhythm.     Pulses: Normal pulses.     Heart sounds: Normal heart sounds.  Pulmonary:     Effort: Pulmonary effort is normal.     Breath sounds: Normal breath sounds.  Abdominal:     General: Abdomen is flat.     Palpations: Abdomen is soft.  Musculoskeletal:        General: Normal range of motion.     Cervical back: Normal range of motion and neck supple.     Comments: Mild bilateral lumbar tenderness.  Patient has positive  straight raise legs on the right leg.  No saddle anesthesia  Skin:    General: Skin is warm.     Capillary Refill: Capillary refill takes less than 2 seconds.  Neurological:     General: No focal deficit present.     Mental Status: She is alert and oriented to person, place, and time.     Comments: No saddle anesthesia.  Psychiatric:        Mood and Affect: Mood normal.        Behavior: Behavior normal.     (all labs ordered are listed, but only abnormal results are displayed) Labs Reviewed  CBC WITH DIFFERENTIAL/PLATELET  BASIC METABOLIC PANEL WITH GFR    EKG: None  Radiology: No results found.   Procedures   Medications Ordered in the ED  HYDROmorphone  (DILAUDID ) injection 1 mg (has no administration in time range)  diazepam  (VALIUM ) injection 2.5 mg (has no administration in time range)                                     Medical Decision Making Susan Grimes is a 78 y.o. female here presenting with back pain radiating down the leg.  Likely sciatica.  Patient did have a fall so we will get CT lumbar spine.  Will give pain medicine and reassess  8:49 PM Reviewed patient's labs and independently interpreted CT scan.  Patient has chronic L3 vertebral body retropulsion.  Patient has moderate spinal canal narrowing.  Patient is feeling better after pain medicine and muscle relaxant.  Patient states that she has no pain contract right now.  Will represcribe short course of her Percocet and will try Flexeril  as well.  Told her that she needs to follow-up with primary care doctor or Dr. Louis for refill of pain meds   Problems Addressed: Hypertension, unspecified type: chronic illness or injury Lumbar radiculopathy: acute illness or injury  Amount and/or Complexity of Data Reviewed Labs: ordered. Decision-making details documented in ED Course. Radiology: ordered and independent interpretation performed. Decision-making details documented in ED Course.  Risk Prescription drug management.     Final diagnoses:  None    ED Discharge Orders     None          Patt Alm Macho, MD 05/31/24 2051

## 2024-06-03 DIAGNOSIS — S32030A Wedge compression fracture of third lumbar vertebra, initial encounter for closed fracture: Secondary | ICD-10-CM | POA: Diagnosis not present

## 2024-06-18 ENCOUNTER — Ambulatory Visit: Admitting: Family Medicine

## 2024-06-28 DIAGNOSIS — E785 Hyperlipidemia, unspecified: Secondary | ICD-10-CM | POA: Diagnosis not present

## 2024-06-28 DIAGNOSIS — N1831 Chronic kidney disease, stage 3a: Secondary | ICD-10-CM | POA: Diagnosis not present

## 2024-07-02 ENCOUNTER — Ambulatory Visit: Admitting: Family Medicine

## 2024-07-06 ENCOUNTER — Other Ambulatory Visit: Payer: Self-pay

## 2024-07-06 ENCOUNTER — Emergency Department (HOSPITAL_COMMUNITY)

## 2024-07-06 ENCOUNTER — Emergency Department (HOSPITAL_COMMUNITY)
Admission: EM | Admit: 2024-07-06 | Discharge: 2024-07-07 | Disposition: A | Discharge status: Home / Self Care | Attending: Emergency Medicine | Admitting: Emergency Medicine

## 2024-07-06 ENCOUNTER — Other Ambulatory Visit (HOSPITAL_COMMUNITY): Payer: Self-pay

## 2024-07-06 DIAGNOSIS — Z743 Need for continuous supervision: Secondary | ICD-10-CM | POA: Diagnosis not present

## 2024-07-06 DIAGNOSIS — M545 Low back pain, unspecified: Secondary | ICD-10-CM | POA: Insufficient documentation

## 2024-07-06 DIAGNOSIS — G8929 Other chronic pain: Secondary | ICD-10-CM | POA: Diagnosis not present

## 2024-07-06 DIAGNOSIS — M542 Cervicalgia: Secondary | ICD-10-CM | POA: Diagnosis not present

## 2024-07-06 DIAGNOSIS — M4722 Other spondylosis with radiculopathy, cervical region: Secondary | ICD-10-CM | POA: Diagnosis not present

## 2024-07-06 DIAGNOSIS — M549 Dorsalgia, unspecified: Secondary | ICD-10-CM | POA: Diagnosis not present

## 2024-07-06 MED ORDER — OXYCODONE-ACETAMINOPHEN 5-325 MG PO TABS
2.0000 | ORAL_TABLET | Freq: Once | ORAL | Status: AC
  Administered 2024-07-06: 2 via ORAL
  Filled 2024-07-06: qty 2

## 2024-07-06 NOTE — ED Triage Notes (Signed)
 Patient BIB EMS from home c/o back pain x 2 months. Patient report pain is radiating to her bilateral shoulder x 2 week. Patient report taking OTC without relief. Patient stated hope you can give me some pain medicine until I can see my PCP.

## 2024-07-07 ENCOUNTER — Other Ambulatory Visit: Payer: Self-pay

## 2024-07-07 ENCOUNTER — Other Ambulatory Visit (HOSPITAL_COMMUNITY): Payer: Self-pay

## 2024-07-07 MED ORDER — OXYCODONE-ACETAMINOPHEN 10-325 MG PO TABS
1.0000 | ORAL_TABLET | ORAL | 0 refills | Status: AC | PRN
  Filled 2024-07-07 (×3): qty 20, 4d supply, fill #0

## 2024-07-07 NOTE — ED Provider Notes (Signed)
 [facility] Provider Note   CSN: [id number] Arrival date & time: 07/06/24  2013     Patient presents with: Back Pain   Susan Grimes is a 78 y.o. female.  Patient with chronic pain, chronic back pain, lumbar back pain, herniated lumbar disc, paresthesias presents to the emergency room complaining of continued low back pain for the past 2 months.  She also complains of 2 weeks of bilateral shoulder discomfort with radiation down both arms.  She denies any weakness but complains of some tingling/burning sensation going down bilateral upper arms.  The patient is supposed to be seeing pain management but states she missed her last appointment and is out of all of her pain medications.  She denies any trauma or injury.    Back Pain      Prior to Admission medications   Medication Sig Start Date End Date Taking? Authorizing Provider  acetaminophen  (TYLENOL ) 500 MG tablet Take 1,000 mg by mouth every 6 (six) hours as needed for mild pain or moderate pain.    [provider]  albuterol  (VENTOLIN  HFA) 108 (90 Base) MCG/ACT inhaler Inhale 1 puff into the lungs every 4 (four) hours as needed for wheezing or shortness of breath. 11/12/19   [provider]  cyclobenzaprine  (FLEXERIL ) 10 MG tablet Take 1 tablet (10 mg total) by mouth 3 (three) times daily as needed for muscle spasms. 12/30/20   Bergman, Meghan D, NP  esomeprazole (NEXIUM) 20 MG capsule Take 20 mg by mouth daily at 12 noon.    [provider]  Fluticasone-Salmeterol (ADVAIR) 250-50 MCG/DOSE AEPB Inhale 1 puff into the lungs daily as needed. 12/07/19   [provider]  losartan  (COZAAR ) 25 MG tablet Take 25 mg by mouth daily. 12/03/19   [provider]  methylPREDNISolone  (MEDROL  DOSEPAK) 4 MG TBPK tablet Use as directed 05/31/24   Patt Alm Macho, MD  montelukast  (SINGULAIR ) 10 MG tablet Take 10 mg by mouth every evening. 02/04/20   [provider]  oxyCODONE -acetaminophen  (PERCOCET) 10-325 MG tablet Take 1 tablet by mouth every 4 (four) hours as needed for pain. 07/07/24   Logan Ubaldo KATHEE, PA-C    Allergies: Asa [aspirin] and Fentanyl     Review of Systems  Musculoskeletal:  Positive for back pain.    Updated Vital Signs BP (!) 179/108   Pulse 61   Temp 98.3 F (36.8 C) (Oral)   Resp 15   SpO2 99%   Physical Exam Vitals and nursing note reviewed.  Constitutional:      General: She is not in acute distress.    Appearance: She is well-developed.  HENT:     Head: Normocephalic and atraumatic.  Eyes:     Conjunctiva/sclera: Conjunctivae normal.  Neck:     Comments: No midline spinal tenderness Cardiovascular:     Rate and Rhythm: Normal rate and regular rhythm.     Heart sounds: No murmur heard. Pulmonary:     Effort: Pulmonary effort is normal. No respiratory distress.     Breath sounds: Normal breath sounds.  Abdominal:     Palpations: Abdomen is soft.     Tenderness: There is no abdominal tenderness.  Musculoskeletal:        General: No swelling or tenderness.     Cervical back: Normal range of motion and neck supple. No tenderness.     Comments: No tenderness to palpation of the lower back or midline spine. Normal gait. Normal range  of motion of bilateral upper and lower extremities.   Skin:    General: Skin is warm and dry.     Capillary Refill: Capillary refill takes less than 2 seconds.  Neurological:     Mental Status: She is alert.  Psychiatric:        Mood and Affect: Mood normal.     (all labs ordered are listed, but only abnormal results are displayed) Labs Reviewed - No data to display  EKG: None  Radiology: CT Cervical Spine Wo Contrast Result Date: 07/06/2024 EXAM: CT CERVICAL SPINE WITHOUT CONTRAST 07/06/2024 11:32:00 PM TECHNIQUE: CT of the cervical spine was performed without the administration of intravenous contrast. Multiplanar reformatted images are provided for review. Automated  exposure control, iterative reconstruction, and/or weight based adjustment of the mA/kV was utilized to reduce the radiation dose to as low as reasonably achievable. COMPARISON: None available. CLINICAL HISTORY: Cervical radiculopathy, no red flags. Patient BIB EMS from home c/o back pain x 2 months. Patient report pain is radiating to her bilateral shoulder x 2 week. Patient report taking OTC without relief. Patient stated hope you can give me some pain medicine until I can see my PCP. FINDINGS: BONES AND ALIGNMENT: No acute fracture or traumatic malalignment. DEGENERATIVE CHANGES: Mild multilevel spondylosis. No severe spinal canal narrowing. SOFT TISSUES: No prevertebral soft tissue swelling. Biapical pleural parenchymal scarring. IMPRESSION: 1. No acute abnormality of the cervical spine. Electronically signed by: Norman Gatlin MD 07/06/2024 11:44 PM EDT RP Workstation: [id number]     Procedures   Medications Ordered in the ED  oxyCODONE -acetaminophen  (PERCOCET/ROXICET) 5-325 MG per tablet 2 tablet (2 tablets Oral Given 07/06/24 2249)                                    Medical Decision Making Amount and/or Complexity of Data Reviewed Radiology: ordered.  Risk Prescription drug management.   This patient presents to the ED for concern of back pain, this involves an extensive number of treatment options, and is a complaint that carries with it a high risk of complications and morbidity.  The differential diagnosis includes fracture, dislocation, soft tissue injury, nerve injury, others   Co morbidities / Chronic conditions that complicate the patient evaluation  Chronic back pain   Additional history obtained:  Additional history obtained from EMR External records from outside source obtained and reviewed including internal medicine notes   Imaging Studies ordered:  I ordered imaging studies including CT cervical spine  I independently visualized and interpreted imaging which  showed no acute findings I agree with the radiologist interpretation   Problem List / ED Course / Critical interventions / Medication management   I ordered medication including percocet   Reevaluation of the patient after these medicines showed that the patient improved I have reviewed the patients home medicines and have made adjustments as needed   Test / Admission - Considered:  Patient with no acute findings on CT. No red flag symptoms related to her low back pain such as urinary or fecal incontinence, urinary retention, saddle anesthesia to suggest cauda equina. Patient with chronic low back pain. Patient states her main concern was pain medication to help until she sees primary care or pain management this week. Patient prescribed short course of pain medication. I explained that refills and further prescriptions would need to be handled by her primary team. She no longer complains of the upper extremity paresthesias.  Plan to have patient follow up with primary care team and with her neurosurgery team for further evaluation as needed. Return precautions provided.       Final diagnoses:  Chronic low back pain without sciatica, unspecified back pain laterality  Neck pain    ED Discharge Orders          Ordered    oxyCODONE -acetaminophen  (PERCOCET) 10-325 MG tablet  Every 4 hours PRN        07/07/24 0049               Logan Ubaldo NOVAK, PA-C 07/07/24 0050    Trine Raynell Moder, MD 07/07/24 [phone]

## 2024-07-07 NOTE — Discharge Instructions (Addendum)
 Your CT scan was reassuring. Please follow up with your primary care team for further management and evaluation as needed. If you need further prescriptions for pain management please follow up with your pain management specialist or primary care. If you develop any life threatening symptoms please return to the emergency department.

## 2024-07-09 DIAGNOSIS — M549 Dorsalgia, unspecified: Secondary | ICD-10-CM | POA: Diagnosis not present

## 2024-07-09 DIAGNOSIS — Z7409 Other reduced mobility: Secondary | ICD-10-CM | POA: Diagnosis not present

## 2024-07-09 DIAGNOSIS — M8000XS Age-related osteoporosis with current pathological fracture, unspecified site, sequela: Secondary | ICD-10-CM | POA: Diagnosis not present

## 2024-07-13 DIAGNOSIS — G894 Chronic pain syndrome: Secondary | ICD-10-CM | POA: Diagnosis not present

## 2024-07-13 DIAGNOSIS — S32000A Wedge compression fracture of unspecified lumbar vertebra, initial encounter for closed fracture: Secondary | ICD-10-CM | POA: Diagnosis not present

## 2024-07-13 DIAGNOSIS — Z79899 Other long term (current) drug therapy: Secondary | ICD-10-CM | POA: Diagnosis not present

## 2024-07-14 ENCOUNTER — Other Ambulatory Visit: Payer: Self-pay

## 2024-07-14 ENCOUNTER — Other Ambulatory Visit (HOSPITAL_COMMUNITY): Payer: Self-pay

## 2024-07-14 MED ORDER — OXYCODONE-ACETAMINOPHEN 5-325 MG PO TABS
1.0000 | ORAL_TABLET | Freq: Three times a day (TID) | ORAL | 0 refills | Status: DC | PRN
  Filled 2024-07-14 – 2024-07-16 (×2): qty 63, 21d supply, fill #0

## 2024-07-14 MED ORDER — NALOXONE HCL 4 MG/0.1ML NA LIQD
1.0000 | NASAL | 0 refills | Status: AC | PRN
  Filled 2024-07-14 – 2024-07-16 (×2): qty 2, 2d supply, fill #0

## 2024-07-15 ENCOUNTER — Other Ambulatory Visit: Payer: Self-pay

## 2024-07-15 ENCOUNTER — Other Ambulatory Visit (HOSPITAL_COMMUNITY): Payer: Self-pay

## 2024-07-16 ENCOUNTER — Other Ambulatory Visit: Payer: Self-pay

## 2024-07-16 ENCOUNTER — Other Ambulatory Visit (HOSPITAL_COMMUNITY): Payer: Self-pay

## 2024-07-17 ENCOUNTER — Other Ambulatory Visit (HOSPITAL_COMMUNITY): Payer: Self-pay

## 2024-07-18 ENCOUNTER — Other Ambulatory Visit (HOSPITAL_COMMUNITY): Payer: Self-pay

## 2024-07-20 ENCOUNTER — Other Ambulatory Visit (HOSPITAL_COMMUNITY): Payer: Self-pay

## 2024-07-28 DIAGNOSIS — E785 Hyperlipidemia, unspecified: Secondary | ICD-10-CM | POA: Diagnosis not present

## 2024-07-28 DIAGNOSIS — N1831 Chronic kidney disease, stage 3a: Secondary | ICD-10-CM | POA: Diagnosis not present

## 2024-08-03 ENCOUNTER — Other Ambulatory Visit (HOSPITAL_COMMUNITY): Payer: Self-pay

## 2024-08-03 DIAGNOSIS — Z79899 Other long term (current) drug therapy: Secondary | ICD-10-CM | POA: Diagnosis not present

## 2024-08-03 DIAGNOSIS — G894 Chronic pain syndrome: Secondary | ICD-10-CM | POA: Diagnosis not present

## 2024-08-03 DIAGNOSIS — S32000A Wedge compression fracture of unspecified lumbar vertebra, initial encounter for closed fracture: Secondary | ICD-10-CM | POA: Diagnosis not present

## 2024-08-03 MED ORDER — OXYCODONE-ACETAMINOPHEN 5-325 MG PO TABS
1.0000 | ORAL_TABLET | Freq: Three times a day (TID) | ORAL | 0 refills | Status: DC | PRN
  Filled 2024-08-04: qty 90, 30d supply, fill #0

## 2024-08-04 ENCOUNTER — Other Ambulatory Visit: Payer: Self-pay

## 2024-08-04 ENCOUNTER — Other Ambulatory Visit (HOSPITAL_COMMUNITY): Payer: Self-pay

## 2024-08-05 ENCOUNTER — Other Ambulatory Visit (HOSPITAL_COMMUNITY): Payer: Self-pay

## 2024-09-01 ENCOUNTER — Other Ambulatory Visit (HOSPITAL_COMMUNITY): Payer: Self-pay

## 2024-09-01 MED ORDER — OXYCODONE-ACETAMINOPHEN 5-325 MG PO TABS
1.0000 | ORAL_TABLET | Freq: Four times a day (QID) | ORAL | 0 refills | Status: DC | PRN
  Filled 2024-09-01: qty 120, 30d supply, fill #0

## 2024-09-02 ENCOUNTER — Other Ambulatory Visit: Payer: Self-pay

## 2024-09-03 ENCOUNTER — Other Ambulatory Visit (HOSPITAL_COMMUNITY): Payer: Self-pay

## 2024-09-11 ENCOUNTER — Other Ambulatory Visit: Payer: Self-pay

## 2024-09-11 ENCOUNTER — Other Ambulatory Visit (HOSPITAL_COMMUNITY): Payer: Self-pay

## 2024-09-11 MED ORDER — PREDNISONE 20 MG PO TABS
20.0000 mg | ORAL_TABLET | Freq: Two times a day (BID) | ORAL | 0 refills | Status: AC
  Filled 2024-09-11: qty 10, 5d supply, fill #0

## 2024-09-15 ENCOUNTER — Other Ambulatory Visit: Payer: Self-pay

## 2024-09-15 ENCOUNTER — Other Ambulatory Visit (HOSPITAL_COMMUNITY): Payer: Self-pay

## 2024-09-15 ENCOUNTER — Other Ambulatory Visit (HOSPITAL_BASED_OUTPATIENT_CLINIC_OR_DEPARTMENT_OTHER): Payer: Self-pay

## 2024-09-28 ENCOUNTER — Other Ambulatory Visit (HOSPITAL_COMMUNITY): Payer: Self-pay

## 2024-09-28 MED ORDER — OXYCODONE-ACETAMINOPHEN 5-325 MG PO TABS
1.0000 | ORAL_TABLET | Freq: Four times a day (QID) | ORAL | 0 refills | Status: DC | PRN
  Filled 2024-09-28 – 2024-10-01 (×2): qty 20, 5d supply, fill #0

## 2024-09-29 ENCOUNTER — Other Ambulatory Visit (HOSPITAL_COMMUNITY): Payer: Self-pay

## 2024-09-30 ENCOUNTER — Other Ambulatory Visit (HOSPITAL_COMMUNITY): Payer: Self-pay

## 2024-10-01 ENCOUNTER — Other Ambulatory Visit (HOSPITAL_COMMUNITY): Payer: Self-pay

## 2024-10-01 ENCOUNTER — Other Ambulatory Visit: Payer: Self-pay

## 2024-10-02 ENCOUNTER — Other Ambulatory Visit (HOSPITAL_COMMUNITY): Payer: Self-pay

## 2024-10-06 ENCOUNTER — Other Ambulatory Visit (HOSPITAL_COMMUNITY): Payer: Self-pay

## 2024-10-06 ENCOUNTER — Other Ambulatory Visit: Payer: Self-pay

## 2024-10-06 MED ORDER — OXYCODONE-ACETAMINOPHEN 5-325 MG PO TABS
1.0000 | ORAL_TABLET | Freq: Four times a day (QID) | ORAL | 0 refills | Status: AC | PRN
  Filled 2024-10-06: qty 120, 30d supply, fill #0

## 2024-10-08 ENCOUNTER — Other Ambulatory Visit (HOSPITAL_COMMUNITY): Payer: Self-pay
